# Patient Record
Sex: Male | Born: 1970 | Race: Black or African American | Hispanic: No | Smoking: Current some day smoker
Health system: Southern US, Community
[De-identification: ages and names within clinical notes are randomized; demographics above are authoritative.]

## PROBLEM LIST (undated history)

## (undated) DIAGNOSIS — M1990 Unspecified osteoarthritis, unspecified site: Secondary | ICD-10-CM

## (undated) DIAGNOSIS — F419 Anxiety disorder, unspecified: Secondary | ICD-10-CM

## (undated) DIAGNOSIS — F329 Major depressive disorder, single episode, unspecified: Secondary | ICD-10-CM

## (undated) DIAGNOSIS — F32A Depression, unspecified: Secondary | ICD-10-CM

## (undated) DIAGNOSIS — G43909 Migraine, unspecified, not intractable, without status migrainosus: Secondary | ICD-10-CM

## (undated) DIAGNOSIS — R519 Headache, unspecified: Secondary | ICD-10-CM

## (undated) HISTORY — PX: OTHER SURGICAL HISTORY: SHX169

## (undated) HISTORY — PX: NOSE SURGERY: SHX723

---
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
## 1992-12-27 HISTORY — PX: OTHER SURGICAL HISTORY: SHX169

## 2002-07-07 ENCOUNTER — Emergency Department (HOSPITAL_COMMUNITY): Admission: EM | Admit: 2002-07-07 | Discharge: 2002-07-07 | Payer: Self-pay | Admitting: Emergency Medicine

## 2003-06-02 ENCOUNTER — Encounter: Payer: Self-pay | Admitting: Emergency Medicine

## 2003-06-02 ENCOUNTER — Emergency Department (HOSPITAL_COMMUNITY): Admission: EM | Admit: 2003-06-02 | Discharge: 2003-06-02 | Payer: Self-pay | Admitting: Emergency Medicine

## 2004-06-08 ENCOUNTER — Emergency Department (HOSPITAL_COMMUNITY): Admission: EM | Admit: 2004-06-08 | Discharge: 2004-06-09 | Payer: Self-pay | Admitting: Emergency Medicine

## 2004-06-14 ENCOUNTER — Emergency Department (HOSPITAL_COMMUNITY): Admission: EM | Admit: 2004-06-14 | Discharge: 2004-06-14 | Payer: Self-pay | Admitting: Emergency Medicine

## 2004-09-17 ENCOUNTER — Emergency Department (HOSPITAL_COMMUNITY): Admission: EM | Admit: 2004-09-17 | Discharge: 2004-09-17 | Payer: Self-pay | Admitting: Emergency Medicine

## 2005-02-23 ENCOUNTER — Emergency Department (HOSPITAL_COMMUNITY): Admission: EM | Admit: 2005-02-23 | Discharge: 2005-02-23 | Payer: Self-pay | Admitting: Emergency Medicine

## 2011-12-19 ENCOUNTER — Emergency Department (HOSPITAL_COMMUNITY)
Admission: EM | Admit: 2011-12-19 | Discharge: 2011-12-19 | Disposition: A | Discharge status: Home / Self Care | Payer: Self-pay | Attending: Emergency Medicine | Admitting: Emergency Medicine

## 2011-12-19 DIAGNOSIS — R3916 Straining to void: Secondary | ICD-10-CM | POA: Insufficient documentation

## 2011-12-19 DIAGNOSIS — K5900 Constipation, unspecified: Secondary | ICD-10-CM | POA: Insufficient documentation

## 2011-12-19 DIAGNOSIS — R109 Unspecified abdominal pain: Secondary | ICD-10-CM | POA: Insufficient documentation

## 2011-12-19 DIAGNOSIS — F17200 Nicotine dependence, unspecified, uncomplicated: Secondary | ICD-10-CM | POA: Insufficient documentation

## 2011-12-19 LAB — URINALYSIS, ROUTINE W REFLEX MICROSCOPIC
Bilirubin Urine: NEGATIVE
Leukocytes, UA: NEGATIVE
Nitrite: NEGATIVE
Specific Gravity, Urine: 1.023 (ref 1.005–1.030)
pH: 5.5 (ref 5.0–8.0)

## 2011-12-19 MED ORDER — POLYETHYLENE GLYCOL 3350 17 GM/SCOOP PO POWD: 17.0000 g | Freq: Every day | ORAL | Status: AC

## 2011-12-19 NOTE — ED Notes (Signed)
Pt states that he has had this problem for about 2 weeks.

## 2011-12-19 NOTE — ED Notes (Signed)
Pt c/o difficulty urinating or having a BM

## 2011-12-19 NOTE — ED Provider Notes (Signed)
Medical screening examination/treatment/procedure(s) were performed by non-physician practitioner and as supervising physician I was immediately available for consultation/collaboration.   Esco Joslyn, MD 12/19/11 0742 
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
## 2012-03-11 ENCOUNTER — Encounter (HOSPITAL_COMMUNITY): Payer: Self-pay | Admitting: Emergency Medicine

## 2012-03-11 ENCOUNTER — Emergency Department (HOSPITAL_COMMUNITY)
Admission: EM | Admit: 2012-03-11 | Discharge: 2012-03-11 | Disposition: A | Discharge status: Home / Self Care | Payer: Self-pay | Attending: Emergency Medicine | Admitting: Emergency Medicine

## 2012-03-11 ENCOUNTER — Emergency Department (HOSPITAL_COMMUNITY): Payer: Self-pay

## 2012-03-11 DIAGNOSIS — J069 Acute upper respiratory infection, unspecified: Secondary | ICD-10-CM

## 2012-03-11 DIAGNOSIS — R059 Cough, unspecified: Secondary | ICD-10-CM

## 2012-03-11 DIAGNOSIS — R05 Cough: Secondary | ICD-10-CM

## 2012-03-11 DIAGNOSIS — R509 Fever, unspecified: Secondary | ICD-10-CM

## 2012-03-11 DIAGNOSIS — F17200 Nicotine dependence, unspecified, uncomplicated: Secondary | ICD-10-CM | POA: Insufficient documentation

## 2012-03-11 MED ORDER — HYDROCOD POLST-CHLORPHEN POLST 10-8 MG/5ML PO LQCR
5.0000 mL | Freq: Once | ORAL | Status: AC
  Administered 2012-03-11: 5 mL via ORAL
  Filled 2012-03-11: qty 5

## 2012-03-11 MED ORDER — AZITHROMYCIN 250 MG PO TABS: ORAL_TABLET | ORAL | Status: AC

## 2012-03-11 MED ORDER — HYDROCOD POLST-CHLORPHEN POLST 10-8 MG/5ML PO LQCR: 5.0000 mL | Freq: Two times a day (BID) | ORAL | Status: DC

## 2012-03-11 NOTE — ED Notes (Signed)
Pt reports nonproductive cough with SOB onset x3 days increased over last 24 hrs

## 2012-03-11 NOTE — Discharge Instructions (Signed)
You were seen and evaluated today for your symptoms of fever, cough and body aches. At this time your chest x-ray did not show any concerning signs for pneumonia infection causing her symptoms. Your providers have decided that you still may benefit from an antibiotic to help with your symptoms. You were given a prescription for Z-Pak to take for the next 5 days. Please take this as prescribed for the full length of time. You're also given a prescription for cough medication to help your symptoms of cough and body aches. Continue to use Tylenol or ibuprofen at home for any fever. Return if you have any worsening chest pain, shortness of breath, distant nausea vomiting.  Fever, Adult A fever is a temperature of 100.4 F (38 C) or above.  HOME CARE  Take fever medicine as told by your doctor. Do not  take aspirin for fever if you are younger than 39 years of age.   If you are given antibiotic medicine, take it as told. Finish the medicine even if you start to feel better.   Rest.   Drink enough fluids to keep your pee (urine) clear or pale yellow. Do not drink alcohol.   Take a bath or shower with room temperature water. Do not use ice water or alcohol sponge baths.   Wear lightweight, loose clothes.  GET HELP RIGHT AWAY IF:   You are short of breath or have trouble breathing.   You are very weak.   You are dizzy or you pass out (faint).   You are very thirsty or are making little or no urine.   You have new pain.   You throw up (vomit) or have watery poop (diarrhea).   You keep throwing up or having watery poop for more than 1 to 2 days.   You have a stiff neck or light bothers your eyes.   You have a skin rash.   You have a fever or problems (symptoms) that last for more than 2 to 3 days.   You have a fever and your problems quickly get worse.   You keep throwing up the fluids you drink.   You do not feel better after 3 days.   You have new problems.  MAKE SURE YOU:    Understand these instructions.   Will watch your condition.   Will get help right away if you are not doing well or get worse.  Document Released: 09/21/2008 Document Revised: 12/02/2011 Document Reviewed: 10/14/2011 [facility] Patient Information 2012 [place], [place].   Upper Respiratory Infection, Adult An upper respiratory infection (URI) is also sometimes known as the common cold. The upper respiratory tract includes the nose, sinuses, throat, trachea, and bronchi. Bronchi are the airways leading to the lungs. Most people improve within 1 week, but symptoms can last up to 2 weeks. A residual cough may last even longer.  CAUSES Many different viruses can infect the tissues lining the upper respiratory tract. The tissues become irritated and inflamed and often become very moist. Mucus production is also common. A cold is contagious. You can easily spread the virus to others by oral contact. This includes kissing, sharing a glass, coughing, or sneezing. Touching your mouth or nose and then touching a surface, which is then touched by another person, can also spread the virus. SYMPTOMS  Symptoms typically develop 1 to 3 days after you come in contact with a cold virus. Symptoms vary from person to person. They may include:  Runny nose.  Sneezing.   Nasal congestion.   Sinus irritation.   Sore throat.   Loss of voice (laryngitis).   Cough.   Fatigue.   Muscle aches.   Loss of appetite.   Headache.   Low-grade fever.  DIAGNOSIS  You might diagnose your own cold based on familiar symptoms, since most people get a cold 2 to 3 times a year. Your caregiver can confirm this based on your exam. Most importantly, your caregiver can check that your symptoms are not due to another disease such as strep throat, sinusitis, pneumonia, asthma, or epiglottitis. Blood tests, throat tests, and X-rays are not necessary to diagnose a common cold, but they may sometimes be helpful in excluding  other more serious diseases. Your caregiver will decide if any further tests are required. RISKS AND COMPLICATIONS  You may be at risk for a more severe case of the common cold if you smoke cigarettes, have chronic heart disease (such as heart failure) or lung disease (such as asthma), or if you have a weakened immune system. The very young and very old are also at risk for more serious infections. Bacterial sinusitis, middle ear infections, and bacterial pneumonia can complicate the common cold. The common cold can worsen asthma and chronic obstructive pulmonary disease (COPD). Sometimes, these complications can require emergency medical care and may be life-threatening. PREVENTION  The best way to protect against getting a cold is to practice good hygiene. Avoid oral or hand contact with people with cold symptoms. Wash your hands often if contact occurs. There is no clear evidence that vitamin C, vitamin E, echinacea, or exercise reduces the chance of developing a cold. However, it is always recommended to get plenty of rest and practice good nutrition. TREATMENT  Treatment is directed at relieving symptoms. There is no cure. Antibiotics are not effective, because the infection is caused by a virus, not by bacteria. Treatment may include:  Increased fluid intake. Sports drinks offer valuable electrolytes, sugars, and fluids.   Breathing heated mist or steam (vaporizer or shower).   Eating chicken soup or other clear broths, and maintaining good nutrition.   Getting plenty of rest.   Using gargles or lozenges for comfort.   Controlling fevers with ibuprofen or acetaminophen as directed by your caregiver.   Increasing usage of your inhaler if you have asthma.  Zinc gel and zinc lozenges, taken in the first 24 hours of the common cold, can shorten the duration and lessen the severity of symptoms. Pain medicines may help with fever, muscle aches, and throat pain. A variety of non-prescription  medicines are available to treat congestion and runny nose. Your caregiver can make recommendations and may suggest nasal or lung inhalers for other symptoms.  HOME CARE INSTRUCTIONS   Only take over-the-counter or prescription medicines for pain, discomfort, or fever as directed by your caregiver.   Use a warm mist humidifier or inhale steam from a shower to increase air moisture. This may keep secretions moist and make it easier to breathe.   Drink enough water and fluids to keep your urine clear or pale yellow.   Rest as needed.   Return to work when your temperature has returned to normal or as your caregiver advises. You may need to stay home longer to avoid infecting others. You can also use a face mask and careful hand washing to prevent spread of the virus.  SEEK MEDICAL CARE IF:   After the first few days, you feel you are getting worse  rather than better.   You need your caregiver's advice about medicines to control symptoms.   You develop chills, worsening shortness of breath, or brown or red sputum. These may be signs of pneumonia.   You develop yellow or brown nasal discharge or pain in the face, especially when you bend forward. These may be signs of sinusitis.   You develop a fever, swollen neck glands, pain with swallowing, or white areas in the back of your throat. These may be signs of strep throat.  SEEK IMMEDIATE MEDICAL CARE IF:   You have a fever.   You develop severe or persistent headache, ear pain, sinus pain, or chest pain.   You develop wheezing, a prolonged cough, cough up blood, or have a change in your usual mucus (if you have chronic lung disease).   You develop sore muscles or a stiff neck.  Document Released: 06/08/2001 Document Revised: 12/02/2011 Document Reviewed: 04/16/2011 [facility] Patient Information 2012 [place], [place].   RESOURCE GUIDE  Dental Problems  Patients with Medicaid: [facility] [phone] [address]                                           [phone] [address] Phone:  [phone]                                                  Phone:  [phone]  If unable to pay or uninsured, contact:  [facility] or [facility]. to become qualified for the adult dental clinic.  Chronic Pain Problems Contact [facility]  [phone] Patients need to be referred by their primary care doctor.  Insufficient Money for Medicine Contact [employer]:  call "211" or [facility] [phone].  No Primary Care Doctor Call [facility]  8197939660 Other agencies that provide inexpensive medical care    [facility]  [phone]    [facility]  [phone]    [facility]  [phone]    [facility]  [phone]    [facility]  [phone]    [facility]  [phone]  Psychological Services [facility]  [phone] [facility]  [phone] [facility]   [phone] (emergency services 309-884-3315)  Substance Abuse Resources [facility]  (628)434-7267 [facility] [phone] [facility] [phone] Floydene Flock (419)767-9577 Residential & Outpatient Substance Abuse Program  (609) 542-1263  Abuse/Neglect [facility] Child Abuse Hotline (954)376-6546 [facility] Child Abuse Hotline (813) 475-7431 (After Hours)  Emergency Shelter [facility] (972)612-0897  M[facility] [place] [facility] 726-601-5484 [facility] [phone]  MRSA Hotline #:   660-603-4921    [facility] Resources  [facility] [place] [facility]     [employer]                          [facility] Dept. [address] [place]  [address]      [address]  Blondell Reveal Phone:  [phone]                                   Phone:  [phone]                 Phone:  [phone]  [facility] Phone:  [phone]  [facility] Child Abuse Hotline 385-711-2201 [phone] (After Hours)
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
## 2012-03-11 NOTE — ED Notes (Signed)
Patient is AOx4 and comfortable with her discharge instructions. 

## 2012-03-17 ENCOUNTER — Emergency Department (HOSPITAL_BASED_OUTPATIENT_CLINIC_OR_DEPARTMENT_OTHER)
Admission: EM | Admit: 2012-03-17 | Discharge: 2012-03-17 | Disposition: A | Discharge status: Home / Self Care | Payer: Self-pay | Attending: Emergency Medicine | Admitting: Emergency Medicine

## 2012-03-17 ENCOUNTER — Other Ambulatory Visit: Payer: Self-pay

## 2012-03-17 ENCOUNTER — Encounter (HOSPITAL_BASED_OUTPATIENT_CLINIC_OR_DEPARTMENT_OTHER): Payer: Self-pay | Admitting: *Deleted

## 2012-03-17 ENCOUNTER — Emergency Department (INDEPENDENT_AMBULATORY_CARE_PROVIDER_SITE_OTHER): Payer: Self-pay

## 2012-03-17 DIAGNOSIS — F17200 Nicotine dependence, unspecified, uncomplicated: Secondary | ICD-10-CM | POA: Insufficient documentation

## 2012-03-17 DIAGNOSIS — R05 Cough: Secondary | ICD-10-CM | POA: Insufficient documentation

## 2012-03-17 DIAGNOSIS — J40 Bronchitis, not specified as acute or chronic: Secondary | ICD-10-CM | POA: Insufficient documentation

## 2012-03-17 DIAGNOSIS — M5380 Other specified dorsopathies, site unspecified: Secondary | ICD-10-CM | POA: Insufficient documentation

## 2012-03-17 DIAGNOSIS — Z79899 Other long term (current) drug therapy: Secondary | ICD-10-CM | POA: Insufficient documentation

## 2012-03-17 DIAGNOSIS — R059 Cough, unspecified: Secondary | ICD-10-CM | POA: Insufficient documentation

## 2012-03-17 DIAGNOSIS — M545 Low back pain: Secondary | ICD-10-CM

## 2012-03-17 DIAGNOSIS — M62830 Muscle spasm of back: Secondary | ICD-10-CM

## 2012-03-17 DIAGNOSIS — M549 Dorsalgia, unspecified: Secondary | ICD-10-CM | POA: Insufficient documentation

## 2012-03-17 LAB — CBC
Hemoglobin: 16.1 g/dL (ref 13.0–17.0)
MCH: 30.7 pg (ref 26.0–34.0)
MCV: 90.5 fL (ref 78.0–100.0)
Platelets: 280 10*3/uL (ref 150–400)
RBC: 5.24 MIL/uL (ref 4.22–5.81)
WBC: 7.1 10*3/uL (ref 4.0–10.5)

## 2012-03-17 LAB — TROPONIN I: Troponin I: <0.3 ng/mL (ref <0.30)

## 2012-03-17 LAB — BASIC METABOLIC PANEL
BUN: 14 mg/dL (ref 6–23)
CO2: 25 mEq/L (ref 19–32)
Chloride: 102 mEq/L (ref 96–112)
Creatinine, Ser: 1 mg/dL (ref 0.50–1.35)
GFR calc Af Amer: >90 mL/min (ref >90)
Potassium: 4.8 mEq/L (ref 3.5–5.1)

## 2012-03-17 LAB — DIFFERENTIAL
Basophils Relative: 0 % (ref 0–1)
Eosinophils Relative: 2 % (ref 0–5)
Lymphs Abs: 3.1 10*3/uL (ref 0.7–4.0)
Monocytes Absolute: 1.1 10*3/uL — ABNORMAL HIGH (ref 0.1–1.0)
Monocytes Relative: 15 % — ABNORMAL HIGH (ref 3–12)

## 2012-03-17 MED ORDER — NAPROXEN 500 MG PO TABS: 500.0000 mg | ORAL_TABLET | Freq: Two times a day (BID) | ORAL | Status: DC

## 2012-03-17 MED ORDER — KETOROLAC TROMETHAMINE 60 MG/2ML IM SOLN
60.0000 mg | Freq: Once | INTRAMUSCULAR | Status: AC
  Administered 2012-03-17: 60 mg via INTRAMUSCULAR
  Filled 2012-03-17: qty 2

## 2012-03-17 MED ORDER — ALBUTEROL SULFATE HFA 108 (90 BASE) MCG/ACT IN AERS: 1.0000 | INHALATION_SPRAY | Freq: Four times a day (QID) | RESPIRATORY_TRACT | Status: DC | PRN

## 2012-03-17 NOTE — ED Notes (Signed)
Pt reports cough x1 week. Pain is now in the back and worsens with coughing.

## 2012-03-17 NOTE — ED Notes (Signed)
Pt states that he was seen at [facility] on sat and Dx with lung infection states that cough has improved but now he is having pain in back while coughing
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
## 2012-03-17 NOTE — Discharge Instructions (Signed)
Spasticity Spasticity is a condition in which certain muscles contract continuously. This causes stiffness or tightness of the muscles. It may interfere with movement, speech, and manner of walking. CAUSES  This condition is usually caused by damage to the portion of the brain or spinal cord that controls voluntary movement. It may occur in association with:  Spinal cord injury.   Multiple sclerosis.   Cerebral palsy.   Brain damage due to lack of oxygen.   Brain trauma.   Severe head injury.   Metabolic diseases such as:   Adrenoleukodystrophy.   ALS (Lou Gehrig's disease).   Phenylketonuria.  SYMPTOMS   Increased muscle tone (hypertonicity).   A series of rapid muscle contractions (clonus).   Exaggerated deep tendon reflexes.   Muscle spasms.   Involuntary crossing of the legs (scissoring).   Fixed joints.  The degree of spasticity varies. It ranges from mild muscle stiffness to severe, painful, and uncontrollable muscle spasms. It can interfere with rehabilitation in patients with certain disorders. It often interferes with daily activities. TREATMENT  Treatment may include:  Medications.   Physical therapy regimens. They may include muscle stretching and range of motion exercises. These help prevent shrinkage or shortening of muscles. They also help reduce the severity of symptoms.   Surgery. This may be recommended for tendon release or to sever the nerve-muscle pathway.  PROGNOSIS  The outcome for those with spasticity depends on:  Severity of the spasticity.   Associated disorder(s).  Document Released: 12/03/2002 Document Revised: 12/02/2011 Document Reviewed: 12/13/2005 [employer] Patient Information 2012 [employer]. 

## 2012-05-27 ENCOUNTER — Emergency Department (HOSPITAL_COMMUNITY)
Admission: EM | Admit: 2012-05-27 | Discharge: 2012-05-28 | Disposition: A | Discharge status: Home / Self Care | Payer: Self-pay | Attending: Emergency Medicine | Admitting: Emergency Medicine

## 2012-05-27 DIAGNOSIS — S50812A Abrasion of left forearm, initial encounter: Secondary | ICD-10-CM

## 2012-05-27 DIAGNOSIS — Y93I9 Activity, other involving external motion: Secondary | ICD-10-CM | POA: Insufficient documentation

## 2012-05-27 DIAGNOSIS — Y998 Other external cause status: Secondary | ICD-10-CM | POA: Insufficient documentation

## 2012-05-27 DIAGNOSIS — IMO0002 Reserved for concepts with insufficient information to code with codable children: Secondary | ICD-10-CM | POA: Insufficient documentation

## 2012-05-27 DIAGNOSIS — S0501XA Injury of conjunctiva and corneal abrasion without foreign body, right eye, initial encounter: Secondary | ICD-10-CM

## 2012-05-27 DIAGNOSIS — S058X9A Other injuries of unspecified eye and orbit, initial encounter: Secondary | ICD-10-CM | POA: Insufficient documentation

## 2012-05-27 DIAGNOSIS — F17200 Nicotine dependence, unspecified, uncomplicated: Secondary | ICD-10-CM | POA: Insufficient documentation

## 2012-05-27 MED ORDER — TETRACAINE HCL 0.5 % OP SOLN: 1.0000 [drp] | Freq: Once | OPHTHALMIC | Status: DC

## 2012-05-27 MED ORDER — FLUORESCEIN SODIUM 1 MG OP STRP
1.0000 | ORAL_STRIP | Freq: Once | OPHTHALMIC | Status: AC
  Administered 2012-05-28: 1 via OPHTHALMIC

## 2012-05-27 MED ORDER — FLUORESCEIN SODIUM 1 MG OP STRP
ORAL_STRIP | OPHTHALMIC | Status: AC
  Administered 2012-05-28: 1 via OPHTHALMIC
  Filled 2012-05-27: qty 2

## 2012-05-27 MED ORDER — OXYCODONE-ACETAMINOPHEN 5-325 MG PO TABS
2.0000 | ORAL_TABLET | Freq: Once | ORAL | Status: AC
  Administered 2012-05-27: 2 via ORAL
  Filled 2012-05-27: qty 2

## 2012-05-27 MED ORDER — FLUORESCEIN SODIUM 1 MG OP STRP: 1.0000 | ORAL_STRIP | Freq: Once | OPHTHALMIC | Status: DC

## 2012-05-27 MED ORDER — TETRACAINE HCL 0.5 % OP SOLN
1.0000 [drp] | Freq: Once | OPHTHALMIC | Status: AC
  Administered 2012-05-27: 2 [drp] via OPHTHALMIC

## 2012-05-27 MED ORDER — TETRACAINE HCL 0.5 % OP SOLN
OPHTHALMIC | Status: AC
  Administered 2012-05-27: 2 [drp] via OPHTHALMIC
  Filled 2012-05-27: qty 2

## 2012-05-27 MED ORDER — CIPROFLOXACIN HCL 0.3 % OP SOLN
1.0000 [drp] | OPHTHALMIC | Status: DC
  Administered 2012-05-28: 1 [drp] via OPHTHALMIC
  Filled 2012-05-27: qty 2.5
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
## 2012-05-28 MED ORDER — OXYCODONE-ACETAMINOPHEN 5-325 MG PO TABS: 1.0000 | ORAL_TABLET | ORAL | Status: AC | PRN

## 2012-05-28 MED ORDER — NAPROXEN 500 MG PO TABS: 500.0000 mg | ORAL_TABLET | Freq: Two times a day (BID) | ORAL | Status: DC

## 2012-05-28 NOTE — Discharge Instructions (Signed)
Corneal abrasion  You have a scratch of the eye on the cornea(the clear part of the eye. This condition may be caused by trauma. It is a common problem for people who wear contact lenses. Proper treatment it is important. No evidence of infection is noted today, but you could develop an infection called a corneal ulcer or have some retained foreign body that may or may not have been noted today in the emergecy department. Ulcers are not only painful, but they may also scar the cornea and cause permanent damage to the eye.  If you wear contact lenses, do not use them until your caregiver approves. Followup care is necessary to be sure the corneal abrasion is healing in 1-2 days. See your caregiver or eye specialist as suggested for followup.   Return immediately if you develop severe pain, pus drainage, changes in vision, fever, or other concerns.  Please use the antibiotic drops 1 drop in the effected eye every 4 hours until you follow up with Opthalmology. (eye specialist).

## 2012-05-28 NOTE — ED Notes (Signed)
500 ml of irrigation to bilateral eyes. Pt diaphoretic and states "take it out now, I am having a panic attack. Morgan lens removed. Comfort given. Pt more calm and decrease in diaphoresis.

## 2012-05-28 NOTE — ED Notes (Signed)
Morgan lenses removed per patient's demand.

## 2012-05-28 NOTE — ED Notes (Signed)
Patient is AOx4 and comfortable with his discharge instructions.  Patient's family is driving him home.

## 2012-05-28 NOTE — ED Notes (Signed)
[employer] at bedside 

## 2012-07-15 ENCOUNTER — Emergency Department (HOSPITAL_BASED_OUTPATIENT_CLINIC_OR_DEPARTMENT_OTHER)
Admission: EM | Admit: 2012-07-15 | Discharge: 2012-07-15 | Discharge status: Against Medical Advice | Payer: Self-pay | Attending: Emergency Medicine | Admitting: Emergency Medicine

## 2012-07-15 ENCOUNTER — Encounter (HOSPITAL_BASED_OUTPATIENT_CLINIC_OR_DEPARTMENT_OTHER): Payer: Self-pay | Admitting: *Deleted

## 2012-07-15 ENCOUNTER — Emergency Department (HOSPITAL_BASED_OUTPATIENT_CLINIC_OR_DEPARTMENT_OTHER): Payer: Self-pay

## 2012-07-15 DIAGNOSIS — R0602 Shortness of breath: Secondary | ICD-10-CM | POA: Insufficient documentation

## 2012-07-15 DIAGNOSIS — F17200 Nicotine dependence, unspecified, uncomplicated: Secondary | ICD-10-CM | POA: Insufficient documentation

## 2012-07-15 LAB — BASIC METABOLIC PANEL
BUN: 11 mg/dL (ref 6–23)
CO2: 28 mEq/L (ref 19–32)
Chloride: 107 mEq/L (ref 96–112)
Glucose, Bld: 101 mg/dL — ABNORMAL HIGH (ref 70–99)
Potassium: 3.7 mEq/L (ref 3.5–5.1)
Sodium: 142 mEq/L (ref 135–145)

## 2012-07-15 LAB — CBC WITH DIFFERENTIAL/PLATELET
Hemoglobin: 14.3 g/dL (ref 13.0–17.0)
Lymphocytes Relative: 40 % (ref 12–46)
Lymphs Abs: 3.1 10*3/uL (ref 0.7–4.0)
Monocytes Relative: 13 % — ABNORMAL HIGH (ref 3–12)
Neutrophils Relative %: 44 % (ref 43–77)
Platelets: 255 10*3/uL (ref 150–400)
RBC: 4.63 MIL/uL (ref 4.22–5.81)
WBC: 7.8 10*3/uL (ref 4.0–10.5)

## 2012-07-15 NOTE — ED Notes (Signed)
Pt c/o SHOB since Thursday. Denies other s/s.

## 2012-07-15 NOTE — ED Provider Notes (Signed)
Medical screening examination/treatment/procedure(s) were performed by non-physician practitioner and as supervising physician I was immediately available for consultation/collaboration.   Adyn Hoes, MD 07/15/12 2335 
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
## 2012-09-21 ENCOUNTER — Encounter (HOSPITAL_BASED_OUTPATIENT_CLINIC_OR_DEPARTMENT_OTHER): Payer: Self-pay | Admitting: *Deleted

## 2012-09-21 ENCOUNTER — Emergency Department (HOSPITAL_BASED_OUTPATIENT_CLINIC_OR_DEPARTMENT_OTHER)
Admission: EM | Admit: 2012-09-21 | Discharge: 2012-09-21 | Disposition: A | Discharge status: Home / Self Care | Payer: Self-pay | Attending: Emergency Medicine | Admitting: Emergency Medicine

## 2012-09-21 DIAGNOSIS — M545 Low back pain, unspecified: Secondary | ICD-10-CM | POA: Insufficient documentation

## 2012-09-21 DIAGNOSIS — M546 Pain in thoracic spine: Secondary | ICD-10-CM | POA: Insufficient documentation

## 2012-09-21 DIAGNOSIS — Z0279 Encounter for issue of other medical certificate: Secondary | ICD-10-CM | POA: Insufficient documentation

## 2012-09-21 DIAGNOSIS — F17200 Nicotine dependence, unspecified, uncomplicated: Secondary | ICD-10-CM | POA: Insufficient documentation

## 2012-09-21 DIAGNOSIS — M549 Dorsalgia, unspecified: Secondary | ICD-10-CM
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
## 2012-09-21 NOTE — ED Notes (Signed)
Pt. Is being assessed by Verline Lema PA C

## 2012-09-21 NOTE — ED Notes (Signed)
Pt. Reports he hurt his back a little yesterday and needs a Drs. Note to return to work tomorrow.

## 2012-09-22 NOTE — ED Provider Notes (Signed)
Medical screening examination/treatment/procedure(s) were performed by non-physician practitioner and as supervising physician I was immediately available for consultation/collaboration.   Gerhard Munch, MD 09/22/12 [phone]

## 2013-06-24 ENCOUNTER — Emergency Department (HOSPITAL_COMMUNITY)
Admission: EM | Admit: 2013-06-24 | Discharge: 2013-06-24 | Disposition: A | Discharge status: Home / Self Care | Payer: Self-pay | Attending: Emergency Medicine | Admitting: Emergency Medicine

## 2013-06-24 ENCOUNTER — Encounter (HOSPITAL_COMMUNITY): Payer: Self-pay | Admitting: Emergency Medicine

## 2013-06-24 ENCOUNTER — Emergency Department (HOSPITAL_COMMUNITY): Payer: Self-pay

## 2013-06-24 DIAGNOSIS — G44009 Cluster headache syndrome, unspecified, not intractable: Secondary | ICD-10-CM | POA: Insufficient documentation

## 2013-06-24 DIAGNOSIS — F17200 Nicotine dependence, unspecified, uncomplicated: Secondary | ICD-10-CM | POA: Insufficient documentation

## 2013-06-24 DIAGNOSIS — J3489 Other specified disorders of nose and nasal sinuses: Secondary | ICD-10-CM | POA: Insufficient documentation

## 2013-06-24 DIAGNOSIS — Z7982 Long term (current) use of aspirin: Secondary | ICD-10-CM | POA: Insufficient documentation

## 2013-06-24 DIAGNOSIS — H5789 Other specified disorders of eye and adnexa: Secondary | ICD-10-CM | POA: Insufficient documentation

## 2013-06-24 DIAGNOSIS — H53149 Visual discomfort, unspecified: Secondary | ICD-10-CM | POA: Insufficient documentation

## 2013-06-24 MED ORDER — DIPHENHYDRAMINE HCL 50 MG/ML IJ SOLN
25.0000 mg | Freq: Once | INTRAMUSCULAR | Status: DC
  Filled 2013-06-24: qty 1

## 2013-06-24 MED ORDER — DEXAMETHASONE SODIUM PHOSPHATE 10 MG/ML IJ SOLN
10.0000 mg | Freq: Once | INTRAMUSCULAR | Status: AC
  Administered 2013-06-24: 10 mg via INTRAVENOUS

## 2013-06-24 MED ORDER — KETOROLAC TROMETHAMINE 30 MG/ML IJ SOLN
30.0000 mg | Freq: Once | INTRAMUSCULAR | Status: AC
  Administered 2013-06-24: 30 mg via INTRAVENOUS
  Filled 2013-06-24: qty 1

## 2013-06-24 MED ORDER — KETOROLAC TROMETHAMINE 60 MG/2ML IM SOLN
60.0000 mg | Freq: Once | INTRAMUSCULAR | Status: DC
  Filled 2013-06-24: qty 2

## 2013-06-24 MED ORDER — METOCLOPRAMIDE HCL 5 MG/ML IJ SOLN
10.0000 mg | Freq: Once | INTRAMUSCULAR | Status: AC
  Administered 2013-06-24: 10 mg via INTRAVENOUS

## 2013-06-24 MED ORDER — DIPHENHYDRAMINE HCL 50 MG/ML IJ SOLN
25.0000 mg | Freq: Once | INTRAMUSCULAR | Status: AC
  Administered 2013-06-24: 25 mg via INTRAVENOUS

## 2013-06-24 MED ORDER — SUMATRIPTAN SUCCINATE 6 MG/0.5ML ~~LOC~~ SOLN
6.0000 mg | Freq: Once | SUBCUTANEOUS | Status: AC
  Administered 2013-06-24: 6 mg via SUBCUTANEOUS
  Filled 2013-06-24: qty 0.5

## 2013-06-24 MED ORDER — SODIUM CHLORIDE 0.9 % IV SOLN
Freq: Once | INTRAVENOUS | Status: AC
  Administered 2013-06-24: 20:00:00 via INTRAVENOUS

## 2013-06-24 MED ORDER — DEXAMETHASONE SODIUM PHOSPHATE 10 MG/ML IJ SOLN
10.0000 mg | Freq: Once | INTRAMUSCULAR | Status: DC
  Filled 2013-06-24: qty 1

## 2013-06-24 MED ORDER — METOCLOPRAMIDE HCL 5 MG/ML IJ SOLN
10.0000 mg | Freq: Once | INTRAMUSCULAR | Status: DC
  Filled 2013-06-24: qty 2

## 2013-06-24 NOTE — ED Notes (Addendum)
Pt c/o r side headache x4 days.  Denies hx of migraines.  Reports taking otc pain medication with no relief. Denies n/v or blurred vision.  Pt reports clear nasal and eye drainage since last night.  Pt also denies any mental status changes.  PT NAD at this time.

## 2013-06-24 NOTE — ED Provider Notes (Signed)
History    CSN: [id number] Arrival date & time 06/24/13  1446  First MD Initiated Contact with Patient 06/24/13 1552     Chief Complaint  Patient presents with  . Headache   (Consider location/radiation/quality/duration/timing/severity/associated sxs/prior Treatment) The history is provided by the patient and medical records. No language interpreter was used.    Jeffrey Cox is a 42 y.o. male  with no medical hx presents to the Emergency Department complaining of gradual, persistent, progressively worsening right sided headache beginning 4 days ago.  He has been taking ASA for the pain which helps, but does not alleviate the pain. Pt states he does not regularly get headaches and his last headache was > 20 years ago.  He denies sick contacts, weakness, numbness.  He has associated watering of the right eye and rhinorrhea of the right nare.  He describes the pain as throbbing beginning behind his right eye and radiating to the back of his neck.  Light and loud noises make the pain worse.  Pt denies fever, chills, chest pain, SOB, abdominal pain, nausea, vomiting, diarrhea, weakness, dizziness, syncope, otalgia, sinus congestion, sore throat.       History reviewed. No pertinent past medical history. Past Surgical History  Procedure Laterality Date  . Hand surgey      right hand   No family history on file. History  Substance Use Topics  . Smoking status: Current Every Day Smoker  . Smokeless tobacco: Not on file  . Alcohol Use: Yes    Review of Systems  Constitutional: Negative for fever, diaphoresis, appetite change, fatigue and unexpected weight change.  HENT: Positive for rhinorrhea. Negative for ear pain, congestion, sneezing, mouth sores, neck pain, neck stiffness, dental problem, postnasal drip and sinus pressure.   Eyes: Positive for photophobia, discharge and redness. Negative for visual disturbance.  Respiratory: Negative for cough, chest tightness, shortness of breath  and wheezing.   Cardiovascular: Negative for chest pain.  Gastrointestinal: Negative for nausea, vomiting, abdominal pain, diarrhea and constipation.  Endocrine: Negative for polydipsia, polyphagia and polyuria.  Genitourinary: Negative for dysuria, urgency, frequency and hematuria.  Musculoskeletal: Negative for back pain.  Skin: Negative for rash.  Allergic/Immunologic: Negative for immunocompromised state.  Neurological: Positive for headaches. Negative for syncope and light-headedness.  Hematological: Does not bruise/bleed easily.  Psychiatric/Behavioral: Negative for sleep disturbance. The patient is not nervous/anxious.     Allergies  Review of patient's allergies indicates no known allergies.  Home Medications   Current Outpatient Rx  Name  Route  Sig  Dispense  Refill  . acetaminophen (TYLENOL) 325 MG tablet   Oral   Take 975 mg by mouth every 6 (six) hours as needed for pain.         Marland Kitchen aspirin 325 MG tablet   Oral   Take 975 mg by mouth every 6 (six) hours as needed for pain.         Marland Kitchen ibuprofen (ADVIL,MOTRIN) 200 MG tablet   Oral   Take 800 mg by mouth every 6 (six) hours as needed for pain.         . naproxen sodium (ANAPROX) 220 MG tablet   Oral   Take 440 mg by mouth every 8 (eight) hours as needed (pain).          BP 120/81  Pulse 80  Temp(Src) 98.1 F (36.7 C) (Oral)  Resp 20  SpO2 100% Physical Exam  Nursing note and vitals reviewed. Constitutional: He is oriented to  person, place, and time. He appears well-developed and well-nourished. No distress.  HENT:  Head: Normocephalic and atraumatic.  Right Ear: Hearing, tympanic membrane, external ear and ear canal normal.  Left Ear: Hearing, tympanic membrane, external ear and ear canal normal.  Nose: Rhinorrhea (right nare only) present. No sinus tenderness. Right sinus exhibits no maxillary sinus tenderness and no frontal sinus tenderness. Left sinus exhibits no maxillary sinus tenderness and no  frontal sinus tenderness.  Mouth/Throat: Uvula is midline, oropharynx is clear and moist and mucous membranes are normal. Mucous membranes are not dry. No edematous. No oropharyngeal exudate, posterior oropharyngeal edema, posterior oropharyngeal erythema or tonsillar abscesses.  Eyes: EOM and lids are normal. Pupils are equal, round, and reactive to light. Right eye exhibits no chemosis, no discharge, no exudate and no hordeolum. No foreign body present in the right eye. Left eye exhibits no chemosis, no discharge, no exudate and no hordeolum. No foreign body present in the left eye. Right conjunctiva is injected. Right conjunctiva has no hemorrhage. Left conjunctiva is not injected. Left conjunctiva has no hemorrhage. No scleral icterus. Right eye exhibits normal extraocular motion. Left eye exhibits normal extraocular motion.  Significant, persistent lacrimation of the right eye Tenderness to the area surrounding the eye; no bony or sinus tenderness  Neck: Normal range of motion, full passive range of motion without pain and phonation normal. Neck supple. No spinous process tenderness and no muscular tenderness present. No rigidity. Normal range of motion present.  Cardiovascular: Normal rate, regular rhythm, S1 normal, S2 normal and intact distal pulses.   Pulses:      Radial pulses are 2+ on the right side, and 2+ on the left side.       Dorsalis pedis pulses are 2+ on the right side, and 2+ on the left side.       Posterior tibial pulses are 2+ on the right side, and 2+ on the left side.  Pulmonary/Chest: Effort normal and breath sounds normal. No accessory muscle usage or stridor. Not tachypneic. No respiratory distress. He has no decreased breath sounds. He has no wheezes. He has no rhonchi. He has no rales. He exhibits no tenderness and no bony tenderness.  Abdominal: Soft. Normal appearance and bowel sounds are normal. There is no tenderness. There is no rigidity, no rebound, no guarding and no  CVA tenderness.  Musculoskeletal: Normal range of motion.  Lymphadenopathy:       Head (right side): No submental, no submandibular, no tonsillar, no preauricular, no posterior auricular and no occipital adenopathy present.       Head (left side): No submental, no tonsillar, no preauricular, no posterior auricular and no occipital adenopathy present.    He has no cervical adenopathy.       Right cervical: No superficial cervical, no deep cervical and no posterior cervical adenopathy present.      Left cervical: No superficial cervical, no deep cervical and no posterior cervical adenopathy present.  Neurological: He is alert and oriented to person, place, and time. He has normal strength and normal reflexes. No cranial nerve deficit or sensory deficit. He exhibits normal muscle tone. He displays a negative Romberg sign. Coordination and gait normal. GCS eye subscore is 4. GCS verbal subscore is 5. GCS motor subscore is 6.  Reflex Scores:      Tricep reflexes are 2+ on the right side and 2+ on the left side.      Bicep reflexes are 2+ on the right side and 2+  on the left side.      Brachioradialis reflexes are 2+ on the right side and 2+ on the left side.      Patellar reflexes are 2+ on the right side and 2+ on the left side.      Achilles reflexes are 2+ on the right side and 2+ on the left side. Speech is clear and goal oriented, follows commands Cranial nerves III - XII without deficit, no facial droop Normal strength in upper and lower extremities bilaterally, strong and equal grip strength Sensation normal to light and sharp touch Moves extremities without ataxia, coordination intact Normal finger to nose and rapid alternating movements Neg romberg, no pronator drift Normal gait Normal heel-shin and balance   Skin: Skin is warm and dry. No rash noted. He is not diaphoretic.  Psychiatric: He has a normal mood and affect. His speech is normal and behavior is normal. Judgment and thought  content normal.    ED Course  Procedures (including critical care time) Labs Reviewed - No data to display Ct Head Wo Contrast  06/24/2013   *RADIOLOGY REPORT*  Clinical Data: Headache, right-sided pain, no trauma  CT HEAD WITHOUT CONTRAST  Technique:  Contiguous axial images were obtained from the base of the skull through the vertex without contrast.  Comparison: None.  Findings: There is no acute intracranial hemorrhage or infarct.  No midline shift or mass lesion.  CSF containing spaces are normal. No extra-axial fluid collection.  Circumferential mucosal thickening is present within the right maxillary sinus.  The frontal sinuses and ethmoidal air cells are partially opacified, right greater than left.  The mastoid air cells are clear.  The calvarium is intact.  IMPRESSION: 1.  No acute intracranial process.  2.  Right maxillary and ethmoidal sinus disease.   Original Report Authenticated By: Rise Mu, M.D.   1. Cluster headache     MDM  Kirstie Mirza presents with 4 days of headache.  Hx and PE clinically consistent with cluster headache.  Pt recently under a significant amount of personal stress.  Will give O2, sumatriptan as abortive therapy.  Will also obtain CT scan as pt does not usually have headaches and this is the worst headache of his life.  Pt neurologically intact without deficit on exam, A&Ox4, NAD, nontoxic, nonseptic appearing.    7:00PM Head CT without acute intracranial process.  I personally reviewed the imaging tests through PACS system.  I reviewed available ER/hospitalization records through the EMR.  Patient states only minor relief of headache with oxygen therapy and sumatriptan. Will give migraine cocktail.  8:29 PM Patient reports headache has resolved completely. He feels much better and is ready to return home.  He remains without neurologic deficit.  Recommended he use the resource guide to find a primary care physician for further evaluation and  treatment as needed if headaches.  I have also discussed reasons to return immediately to the ER.  Patient expresses understanding and agrees with plan.      Dahlia Client Stephanine Reas, PA-C 06/24/13 2029

## 2013-06-26 NOTE — ED Provider Notes (Signed)
History/physical exam/procedure(s) were performed by non-physician practitioner and as supervising physician I was immediately available for consultation/collaboration. I have reviewed all notes and am in agreement with care and plan.   Hilario Quarry, MD 06/26/13 [phone]

## 2013-09-25 ENCOUNTER — Encounter (HOSPITAL_BASED_OUTPATIENT_CLINIC_OR_DEPARTMENT_OTHER): Payer: Self-pay | Admitting: *Deleted

## 2013-09-25 ENCOUNTER — Emergency Department (HOSPITAL_BASED_OUTPATIENT_CLINIC_OR_DEPARTMENT_OTHER)
Admission: EM | Admit: 2013-09-25 | Discharge: 2013-09-25 | Disposition: A | Discharge status: Home / Self Care | Payer: Self-pay | Attending: Emergency Medicine | Admitting: Emergency Medicine

## 2013-09-25 DIAGNOSIS — R51 Headache: Secondary | ICD-10-CM | POA: Insufficient documentation

## 2013-09-25 DIAGNOSIS — F17200 Nicotine dependence, unspecified, uncomplicated: Secondary | ICD-10-CM | POA: Insufficient documentation

## 2013-09-25 MED ORDER — SUMATRIPTAN SUCCINATE 6 MG/0.5ML ~~LOC~~ SOLN
6.0000 mg | Freq: Once | SUBCUTANEOUS | Status: AC
  Administered 2013-09-25: 6 mg via SUBCUTANEOUS
  Filled 2013-09-25: qty 0.5

## 2013-09-25 MED ORDER — SUMATRIPTAN SUCCINATE 100 MG PO TABS: 100.0000 mg | ORAL_TABLET | ORAL | Status: DC | PRN

## 2013-09-25 NOTE — ED Notes (Addendum)
Headache started yesterday, pain is better today. Has had a cluster headache in the past

## 2013-09-25 NOTE — ED Provider Notes (Signed)
:TIME SEEN: 7:21 AM  CHIEF COMPLAINT: Cluster headache  HPI: Patient is a 42 y.o. male with a history of cluster headaches who presents the emergency department with a gradual onset posterior, dull headache that radiates into the top of his head and into his left eye that started yesterday morning. He states this feels like his prior cluster headaches. It is worse with lights and sounds. Slightly improved with ibuprofen, Aleve and Tylenol at home. He describes the headache as mild now but last night describes as severe. He has had to be evaluated in the emergency department once before and reports his headache improved after Imitrex, oxygen, Toradol, Reglan, Benadryl. He denies any thunderclap headache. No fever, neck pain or neck stiffness, sick contacts, head injury, anticoagulation, numbness or tingling, focal weakness. He states he did not get any lacrimation or rhinorrhea with this headache.  ROS: See HPI Constitutional: no fever  Eyes: no drainage  ENT: no runny nose   Cardiovascular:  no chest pain  Resp: no SOB  GI: no vomiting GU: no dysuria Integumentary: no rash  Allergy: no hives  Musculoskeletal: no leg swelling  Neurological: no slurred speech ROS otherwise negative  PAST MEDICAL HISTORY/PAST SURGICAL HISTORY:  History reviewed. No pertinent past medical history.  MEDICATIONS:  Prior to Admission medications   Medication Sig Start Date End Date Taking? Authorizing Provider  acetaminophen (TYLENOL) 325 MG tablet Take 975 mg by mouth every 6 (six) hours as needed for pain.    Historical Provider, MD  aspirin 325 MG tablet Take 975 mg by mouth every 6 (six) hours as needed for pain.    Historical Provider, MD  ibuprofen (ADVIL,MOTRIN) 200 MG tablet Take 800 mg by mouth every 6 (six) hours as needed for pain.    Historical Provider, MD  naproxen sodium (ANAPROX) 220 MG tablet Take 440 mg by mouth every 8 (eight) hours as needed (pain).    Historical Provider, MD     ALLERGIES:  No Known Allergies  SOCIAL HISTORY:  History  Substance Use Topics  . Smoking status: Current Every Day Smoker  . Smokeless tobacco: Not on file  . Alcohol Use: Yes    FAMILY HISTORY: No family history on file.  EXAM: BP 121/74  Pulse 84  Temp(Src) 98.4 F (36.9 C) (Oral)  Resp 18  Ht 5\' 8"  (1.727 m)  Wt 283 lb (128.368 kg)  BMI 43.04 kg/m2  SpO2 100% CONSTITUTIONAL: Alert and oriented and responds appropriately to questions. Well-appearing; well-nourished HEAD: Normocephalic EYES: Conjunctivae clear, PERRL ENT: normal nose; no rhinorrhea; moist mucous membranes; pharynx without lesions noted NECK: Supple, no meningismus, no LAD  CARD: RRR; S1 and S2 appreciated; no murmurs, no clicks, no rubs, no gallops RESP: Normal chest excursion without splinting or tachypnea; breath sounds clear and equal bilaterally; no wheezes, no rhonchi, no rales,  ABD/GI: Normal bowel sounds; non-distended; soft, non-tender, no rebound, no guarding BACK:  The back appears normal and is non-tender to palpation, there is no CVA tenderness EXT: Normal ROM in all joints; non-tender to palpation; no edema; normal capillary refill; no cyanosis    SKIN: Normal color for age and race; warm NEURO: Moves all extremities equally, strength 5/5 in all 4 extremities, sensation to light touch intact diffusely, cranial nerves II through XII intact, normal gait PSYCH: The patient's mood and manner are appropriate. Grooming and personal hygiene are appropriate.  MEDICAL DECISION MAKING: Patient here with his typical cluster headache. He reports that it has improved after taking  medications at home but has not completely gone. I am not concerned for intracranial hemorrhage, infarct, meningitis or encephalitis given patient is afebrile,, anticoagulation is neurologically intact. Will place oxygen on patient and give Imitrex and reassess.  ED PROGRESS: Patient reports his headache is almost completely  gone he is starting to go home. Will discharge home with prescription for Imitrex given that is giving him relief in the past as well as today. Given usual and customary return precautions. Patient verbalized understanding and is comfortable with plan.     Layla Maw Kourtney Montesinos, DO 09/25/13 [phone]

## 2013-10-23 ENCOUNTER — Encounter (HOSPITAL_BASED_OUTPATIENT_CLINIC_OR_DEPARTMENT_OTHER): Payer: Self-pay | Admitting: Emergency Medicine

## 2013-10-23 ENCOUNTER — Emergency Department (HOSPITAL_BASED_OUTPATIENT_CLINIC_OR_DEPARTMENT_OTHER)
Admission: EM | Admit: 2013-10-23 | Discharge: 2013-10-23 | Disposition: A | Discharge status: Home / Self Care | Payer: Self-pay | Attending: Emergency Medicine | Admitting: Emergency Medicine

## 2013-10-23 DIAGNOSIS — G44009 Cluster headache syndrome, unspecified, not intractable: Secondary | ICD-10-CM | POA: Insufficient documentation

## 2013-10-23 DIAGNOSIS — F17200 Nicotine dependence, unspecified, uncomplicated: Secondary | ICD-10-CM | POA: Insufficient documentation

## 2013-10-23 HISTORY — DX: Migraine, unspecified, not intractable, without status migrainosus: G43.909

## 2013-10-23 MED ORDER — METOCLOPRAMIDE HCL 5 MG/ML IJ SOLN: 10.0000 mg | Freq: Once | INTRAMUSCULAR | Status: DC

## 2013-10-23 MED ORDER — KETOROLAC TROMETHAMINE 30 MG/ML IJ SOLN: 30.0000 mg | Freq: Once | INTRAMUSCULAR | Status: DC

## 2013-10-23 MED ORDER — SUMATRIPTAN SUCCINATE 6 MG/0.5ML ~~LOC~~ SOLN
6.0000 mg | Freq: Once | SUBCUTANEOUS | Status: AC
  Administered 2013-10-23: 6 mg via SUBCUTANEOUS
  Filled 2013-10-23: qty 0.5

## 2013-10-23 MED ORDER — DIPHENHYDRAMINE HCL 50 MG/ML IJ SOLN: 50.0000 mg | Freq: Once | INTRAMUSCULAR | Status: DC

## 2013-10-23 MED ORDER — SUMATRIPTAN SUCCINATE 6 MG/0.5ML ~~LOC~~ SOLN: 6.0000 mg | SUBCUTANEOUS | Status: DC | PRN

## 2013-10-23 NOTE — ED Notes (Signed)
Headaches for past couple days.  Diagnosed with cluster migraines recently but cannot afford the medicine.

## 2013-10-23 NOTE — ED Provider Notes (Signed)
CSN: [id number]     Arrival date & time 10/23/13  1854 History   First MD Initiated Contact with Patient 10/23/13 2056     Chief Complaint  Patient presents with  . Migraine   (Consider location/radiation/quality/duration/timing/severity/associated sxs/prior Treatment) HPI Pt presents with c/o headache.  He has been diagnosed in the past with cluster headaches and states he cannot afford the imitrex that he has been prescribed in the past. Headache has been constant over the past several days.  Pain is constant, tight feeling behind his eyes.  Also associated with photophobia and tearing of eyes and running of nose.  No fever/chills.  No neck pain.  Headache was gradual in onset.  He has been taking ibuprofen with minimal relief.  There are no other associated systemic symptoms, there are no other alleviating or modifying factors.   Past Medical History  Diagnosis Date  . Migraines    Past Surgical History  Procedure Laterality Date  . Hand surgey      right hand  . Nose surgery     No family history on file. History  Substance Use Topics  . Smoking status: Current Every Day Smoker -- 0.50 packs/day  . Smokeless tobacco: Not on file  . Alcohol Use: Yes    Review of Systems ROS reviewed and all otherwise negative except for mentioned in HPI  Allergies  Review of patient's allergies indicates no known allergies.  Home Medications   Current Outpatient Rx  Name  Route  Sig  Dispense  Refill  . acetaminophen (TYLENOL) 325 MG tablet   Oral   Take 975 mg by mouth every 6 (six) hours as needed for pain.         Marland Kitchen aspirin 325 MG tablet   Oral   Take 975 mg by mouth every 6 (six) hours as needed for pain.         Marland Kitchen ibuprofen (ADVIL,MOTRIN) 200 MG tablet   Oral   Take 800 mg by mouth every 6 (six) hours as needed for pain.         . naproxen sodium (ANAPROX) 220 MG tablet   Oral   Take 440 mg by mouth every 8 (eight) hours as needed (pain).         . SUMAtriptan  (IMITREX) 100 MG tablet   Oral   Take 1 tablet (100 mg total) by mouth every 2 (two) hours as needed for migraine. May repeat in 2 hours if headache persists or recurs.   10 tablet   0   . SUMAtriptan (IMITREX) 6 MG/0.5ML SOLN injection   Subcutaneous   Inject 0.5 mLs (6 mg total) into the skin every 2 (two) hours as needed for migraine or headache. May repeat in 2 hours if headache persists or recurs.   4 vial   0    BP 130/85  Pulse 83  Temp(Src) 99 F (37.2 C) (Oral)  Resp 16  Ht 5\' 7"  (1.702 m)  Wt 280 lb (127.007 kg)  BMI 43.84 kg/m2  SpO2 98% Vitals reviewed Physical Exam Physical Examination: General appearance - alert, well appearing, and in no distress Mental status - alert, oriented to person, place, and time Eyes - pupils equal and reactive, extraocular eye movements intact Mouth - mucous membranes moist, pharynx normal without lesions Neck - supple, no significant adenopathy Chest - clear to auscultation, no wheezes, rales or rhonchi, symmetric air entry Heart - normal rate, regular rhythm, normal S1, S2, no murmurs, rubs, clicks  or gallops Abdomen - soft, nontender, nondistended, no masses or organomegaly Neurological - alert, oriented x 3, cranial nerves 2-12 tested and intact, strength 5/5 in extremities x 4, sensation intact Extremities - peripheral pulses normal, no pedal edema, no clubbing or cyanosis Skin - normal coloration and turgor, no rashes  ED Course  Procedures (including critical care time) Labs Review Labs Reviewed - No data to display Imaging Review No results found.  EKG Interpretation   None       MDM   1. Cluster headache    Pt presenting with headache similar to his prior headaches, symptoms and exam are c/w cluster type headaches.  Pt treated with hi flow oxygen, given imitrex and had improvement in his symptoms.  Neurological exam is reassuring.  Pt given information for neurology followup.  Discharged with strict return  precautions.  Pt agreeable with plan.    Ethelda Chick, MD 10/24/13 [phone]

## 2013-10-23 NOTE — Progress Notes (Signed)
Placed patient of 100% non rebreather for cluster headache

## 2013-11-13 ENCOUNTER — Ambulatory Visit: Payer: Self-pay

## 2014-01-01 ENCOUNTER — Emergency Department (HOSPITAL_BASED_OUTPATIENT_CLINIC_OR_DEPARTMENT_OTHER)
Admission: EM | Admit: 2014-01-01 | Discharge: 2014-01-01 | Disposition: A | Discharge status: Home / Self Care | Payer: Self-pay | Attending: Emergency Medicine | Admitting: Emergency Medicine

## 2014-01-01 ENCOUNTER — Encounter (HOSPITAL_BASED_OUTPATIENT_CLINIC_OR_DEPARTMENT_OTHER): Payer: Self-pay | Admitting: Emergency Medicine

## 2014-01-01 DIAGNOSIS — Z7982 Long term (current) use of aspirin: Secondary | ICD-10-CM | POA: Insufficient documentation

## 2014-01-01 DIAGNOSIS — S39012A Strain of muscle, fascia and tendon of lower back, initial encounter: Secondary | ICD-10-CM

## 2014-01-01 DIAGNOSIS — Y9389 Activity, other specified: Secondary | ICD-10-CM | POA: Insufficient documentation

## 2014-01-01 DIAGNOSIS — Y9289 Other specified places as the place of occurrence of the external cause: Secondary | ICD-10-CM | POA: Insufficient documentation

## 2014-01-01 DIAGNOSIS — IMO0002 Reserved for concepts with insufficient information to code with codable children: Secondary | ICD-10-CM | POA: Insufficient documentation

## 2014-01-01 DIAGNOSIS — Y990 Civilian activity done for income or pay: Secondary | ICD-10-CM | POA: Insufficient documentation

## 2014-01-01 DIAGNOSIS — G43909 Migraine, unspecified, not intractable, without status migrainosus: Secondary | ICD-10-CM | POA: Insufficient documentation

## 2014-01-01 DIAGNOSIS — F17200 Nicotine dependence, unspecified, uncomplicated: Secondary | ICD-10-CM | POA: Insufficient documentation

## 2014-01-01 DIAGNOSIS — X500XXA Overexertion from strenuous movement or load, initial encounter: Secondary | ICD-10-CM | POA: Insufficient documentation

## 2014-01-01 NOTE — Discharge Instructions (Signed)
Back Pain, Adult Low back pain is very common. About 1 in 5 people have back pain.The cause of low back pain is rarely dangerous. The pain often gets better over time.About half of people with a sudden onset of back pain feel better in just 2 weeks. About 8 in 10 people feel better by 6 weeks.  CAUSES Some common causes of back pain include:  Strain of the muscles or ligaments supporting the spine.  Wear and tear (degeneration) of the spinal discs.  Arthritis.  Direct injury to the back. DIAGNOSIS Most of the time, the direct cause of low back pain is not known.However, back pain can be treated effectively even when the exact cause of the pain is unknown.Answering your caregiver's questions about your overall health and symptoms is one of the most accurate ways to make sure the cause of your pain is not dangerous. If your caregiver needs more information, he or she may order lab work or imaging tests (X-rays or MRIs).However, even if imaging tests show changes in your back, this usually does not require surgery. HOME CARE INSTRUCTIONS For many people, back pain returns.Since low back pain is rarely dangerous, it is often a condition that people can learn to manageon their own.   Remain active. It is stressful on the back to sit or stand in one place. Do not sit, drive, or stand in one place for more than 30 minutes at a time. Take short walks on level surfaces as soon as pain allows.Try to increase the length of time you walk each day.  Do not stay in bed.Resting more than 1 or 2 days can delay your recovery.  Do not avoid exercise or work.Your body is made to move.It is not dangerous to be active, even though your back may hurt.Your back will likely heal faster if you return to being active before your pain is gone.  Pay attention to your body when you bend and lift. Many people have less discomfortwhen lifting if they bend their knees, keep the load close to their bodies,and  avoid twisting. Often, the most comfortable positions are those that put less stress on your recovering back.  Find a comfortable position to sleep. Use a firm mattress and lie on your side with your knees slightly bent. If you lie on your back, put a pillow under your knees.  Only take over-the-counter or prescription medicines as directed by your caregiver. Over-the-counter medicines to reduce pain and inflammation are often the most helpful.Your caregiver may prescribe muscle relaxant drugs.These medicines help dull your pain so you can more quickly return to your normal activities and healthy exercise.  Put ice on the injured area.  Put ice in a plastic bag.  Place a towel between your skin and the bag.  Leave the ice on for 15-20 minutes, 03-04 times a day for the first 2 to 3 days. After that, ice and heat may be alternated to reduce pain and spasms.  Ask your caregiver about trying back exercises and gentle massage. This may be of some benefit.  Avoid feeling anxious or stressed.Stress increases muscle tension and can worsen back pain.It is important to recognize when you are anxious or stressed and learn ways to manage it.Exercise is a great option. SEEK MEDICAL CARE IF:  You have pain that is not relieved with rest or medicine.  You have pain that does not improve in 1 week.  You have new symptoms.  You are generally not feeling well. SEEK   IMMEDIATE MEDICAL CARE IF:   You have pain that radiates from your back into your legs.  You develop new bowel or bladder control problems.  You have unusual weakness or numbness in your arms or legs.  You develop nausea or vomiting.  You develop abdominal pain.  You feel faint. Document Released: 12/13/2005 Document Revised: 06/13/2012 Document Reviewed: 05/03/2011 [employer] Patient Information 2014 [employer].  

## 2014-01-01 NOTE — ED Provider Notes (Signed)
CSN: [id number]     Arrival date & time 01/01/14  1407 History   First MD Initiated Contact with Patient 01/01/14 1741     This chart was scribed for Rolan Bucco, MD by Arlan Organ, ED Scribe. This patient was seen in room MH05/MH05 and the patient's care was started 6:43 PM.   Chief Complaint  Patient presents with  . Back Pain   Patient is a 43 y.o. male presenting with back pain. The history is provided by the patient. No language interpreter was used.  Back Pain Radiates to:  Does not radiate Pain severity:  No pain Onset quality:  Gradual Duration:  3 days Timing:  Sporadic Progression:  Resolved Chronicity:  New Relieved by:  None tried Worsened by:  Nothing tried Ineffective treatments:  None tried Associated symptoms: no abdominal pain, no chest pain, no fever, no headaches, no numbness and no weakness     HPI Comments: Jeffrey Cox is a 43 y.o. male who presents to the Emergency Department complaining of gradual onset back pain that initially started 3 days ago. Pt states he hurt his back while playing with his children. He states his daughter jumped in his arms, and he felt a "pull". He denies any chronic back pain. He denies any numbness or tingling in his lower extremities. He denies abdominal pain, fever, chills, bowel or urinary changes.  He states the pain has completely gone today, but he needs a note to go back to work.  He feels that the injury is very minor and does not feel that it will be any problem to do his job.   Past Medical History  Diagnosis Date  . Migraines    Past Surgical History  Procedure Laterality Date  . Hand surgey      right hand  . Nose surgery     No family history on file. History  Substance Use Topics  . Smoking status: Current Every Day Smoker -- 0.50 packs/day    Types: Cigarettes  . Smokeless tobacco: Not on file  . Alcohol Use: Yes    Review of Systems  Constitutional: Negative for fever, chills, diaphoresis and fatigue.   HENT: Negative for congestion, rhinorrhea and sneezing.   Eyes: Negative.   Respiratory: Negative for cough, chest tightness and shortness of breath.   Cardiovascular: Negative for chest pain and leg swelling.  Gastrointestinal: Negative for nausea, vomiting, abdominal pain, diarrhea and blood in stool.  Genitourinary: Negative for frequency, hematuria, flank pain and difficulty urinating.  Musculoskeletal: Positive for back pain. Negative for arthralgias.  Skin: Negative for rash.  Neurological: Negative for dizziness, speech difficulty, weakness, numbness and headaches.    Allergies  Review of patient's allergies indicates no known allergies.  Home Medications   Current Outpatient Rx  Name  Route  Sig  Dispense  Refill  . acetaminophen (TYLENOL) 325 MG tablet   Oral   Take 975 mg by mouth every 6 (six) hours as needed for pain.         Marland Kitchen aspirin 325 MG tablet   Oral   Take 975 mg by mouth every 6 (six) hours as needed for pain.         Marland Kitchen ibuprofen (ADVIL,MOTRIN) 200 MG tablet   Oral   Take 800 mg by mouth every 6 (six) hours as needed for pain.         . naproxen sodium (ANAPROX) 220 MG tablet   Oral   Take 440 mg by mouth  every 8 (eight) hours as needed (pain).         . SUMAtriptan (IMITREX) 100 MG tablet   Oral   Take 1 tablet (100 mg total) by mouth every 2 (two) hours as needed for migraine. May repeat in 2 hours if headache persists or recurs.   10 tablet   0   . SUMAtriptan (IMITREX) 6 MG/0.5ML SOLN injection   Subcutaneous   Inject 0.5 mLs (6 mg total) into the skin every 2 (two) hours as needed for migraine or headache. May repeat in 2 hours if headache persists or recurs.   4 vial   0    Triage Vitals: BP 123/91  Pulse 84  Temp(Src) 98.6 F (37 C) (Oral)  Resp 20  Ht 5\' 8"  (1.727 m)  Wt 250 lb (113.399 kg)  BMI 38.02 kg/m2  SpO2 99%  Physical Exam  Constitutional: He is oriented to person, place, and time. He appears well-developed and  well-nourished.  HENT:  Head: Normocephalic and atraumatic.  Eyes: Pupils are equal, round, and reactive to light.  Neck: Normal range of motion. Neck supple.  Cardiovascular: Normal rate, regular rhythm and normal heart sounds.   Pulmonary/Chest: Effort normal and breath sounds normal. No respiratory distress. He has no wheezes. He has no rales. He exhibits no tenderness.  Abdominal: Soft. Bowel sounds are normal. There is no tenderness. There is no rebound and no guarding.  Musculoskeletal: Normal range of motion. He exhibits no edema.  No pain on palpation of spine.  Normal gait.  Normal sensation/motor function.    Lymphadenopathy:    He has no cervical adenopathy.  Neurological: He is alert and oriented to person, place, and time.  Skin: Skin is warm and dry. No rash noted.  Psychiatric: He has a normal mood and affect.    ED Course  Procedures (including critical care time)  DIAGNOSTIC STUDIES: Oxygen Saturation is 99% on RA, Normal by my interpretation.    COORDINATION OF CARE: 6:42 PM- Will provide pt with a note for work. Discussed treatment plan with pt at bedside and pt agreed to plan.     Labs Review Labs Reviewed - No data to display Imaging Review No results found.  EKG Interpretation   None       MDM   1. Back strain, initial encounter    Patient presents with back pain is completely resolved. He needs a note to go back to work. He has no ongoing pain or neurologic deficits. It was likely musculoskeletal in origin.  I personally performed the services described in this documentation, which was scribed in my presence.  The recorded information has been reviewed and considered.   Rolan BuccoMelanie Jannel Lynne, MD 01/01/14 1901

## 2014-01-01 NOTE — ED Notes (Signed)
Mid back pain x 1 week. 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 His job requires a work note to come back to work.

## 2014-01-17 ENCOUNTER — Encounter (HOSPITAL_BASED_OUTPATIENT_CLINIC_OR_DEPARTMENT_OTHER): Payer: Self-pay | Admitting: Emergency Medicine

## 2014-01-17 ENCOUNTER — Emergency Department (HOSPITAL_BASED_OUTPATIENT_CLINIC_OR_DEPARTMENT_OTHER)
Admission: EM | Admit: 2014-01-17 | Discharge: 2014-01-17 | Disposition: A | Discharge status: Home / Self Care | Payer: Self-pay | Attending: Emergency Medicine | Admitting: Emergency Medicine

## 2014-01-17 DIAGNOSIS — IMO0002 Reserved for concepts with insufficient information to code with codable children: Secondary | ICD-10-CM | POA: Insufficient documentation

## 2014-01-17 DIAGNOSIS — Z7982 Long term (current) use of aspirin: Secondary | ICD-10-CM | POA: Insufficient documentation

## 2014-01-17 DIAGNOSIS — Y929 Unspecified place or not applicable: Secondary | ICD-10-CM | POA: Insufficient documentation

## 2014-01-17 DIAGNOSIS — F17200 Nicotine dependence, unspecified, uncomplicated: Secondary | ICD-10-CM | POA: Insufficient documentation

## 2014-01-17 DIAGNOSIS — Y9389 Activity, other specified: Secondary | ICD-10-CM | POA: Insufficient documentation

## 2014-01-17 DIAGNOSIS — S3992XA Unspecified injury of lower back, initial encounter: Secondary | ICD-10-CM

## 2014-01-17 DIAGNOSIS — G43909 Migraine, unspecified, not intractable, without status migrainosus: Secondary | ICD-10-CM | POA: Insufficient documentation

## 2014-01-17 DIAGNOSIS — X500XXA Overexertion from strenuous movement or load, initial encounter: Secondary | ICD-10-CM | POA: Insufficient documentation

## 2014-01-17 DIAGNOSIS — Z79899 Other long term (current) drug therapy: Secondary | ICD-10-CM | POA: Insufficient documentation

## 2014-01-17 NOTE — ED Notes (Signed)
Playing with kids picked one up and felt back tighten up still having pain and tightness

## 2014-01-17 NOTE — Discharge Instructions (Signed)
Back Exercises  Back exercises help treat and prevent back injuries. The goal is to increase your strength in your belly (abdominal) and back muscles. These exercises can also help with flexibility. Start these exercises when told by your doctor.  HOME CARE  Back exercises include:  Pelvic Tilt.  · Lie on your back with your knees bent. Tilt your pelvis until the lower part of your back is against the floor. Hold this position 5 to 10 sec. Repeat this exercise 5 to 10 times.  Knee to Chest.  · Pull 1 knee up against your chest and hold for 20 to 30 seconds. Repeat this with the other knee. This may be done with the other leg straight or bent, whichever feels better. Then, pull both knees up against your chest.  Sit-Ups or Curl-Ups.  · Bend your knees 90 degrees. Start with tilting your pelvis, and do a partial, slow sit-up. Only lift your upper half 30 to 45 degrees off the floor. Take at least 2 to 3 seonds for each sit-up. Do not do sit-ups with your knees out straight. If partial sit-ups are difficult, simply do the above but with only tightening your belly (abdominal) muscles and holding it as told.  Hip-Lift.  · Lie on your back with your knees flexed 90 degrees. Push down with your feet and shoulders as you raise your hips 2 inches off the floor. Hold for 10 seconds, repeat 5 to 10 times.  Back Arches.  · Lie on your stomach. Prop yourself up on bent elbows. Slowly press on your hands, causing an arch in your low back. Repeat 3 to 5 times.  Shoulder-Lifts.  · Lie face down with arms beside your body. Keep hips and belly pressed to floor as you slowly lift your head and shoulders off the floor.  Do not overdo your exercises. Be careful in the beginning. Exercises may cause you some mild back discomfort. If the pain lasts for more than 15 minutes, stop the exercises until you see your doctor. Improvement with exercise for back problems is slow.   Document Released: 01/15/2011 Document Revised: 03/06/2012  Document Reviewed: 10/14/2011  [employer]® Patient Information ©2014 [employer].

## 2014-01-17 NOTE — ED Provider Notes (Signed)
CSN: [id number]     Arrival date & time 01/17/14  0932 History   First MD Initiated Contact with Patient 01/17/14 1202     Chief Complaint  Patient presents with  . Back Pain    HPI Playing with kids picked one up and felt back tighten up still having pain and tightness  Past Medical History  Diagnosis Date  . Migraines    Past Surgical History  Procedure Laterality Date  . Hand surgey      right hand  . Nose surgery     History reviewed. No pertinent family history. History  Substance Use Topics  . Smoking status: Current Every Day Smoker -- 0.50 packs/day    Types: Cigarettes  . Smokeless tobacco: Not on file  . Alcohol Use: Yes    Review of Systems  All other systems reviewed and are negative.    Allergies  Review of patient's allergies indicates no known allergies.  Home Medications   Current Outpatient Rx  Name  Route  Sig  Dispense  Refill  . acetaminophen (TYLENOL) 325 MG tablet   Oral   Take 975 mg by mouth every 6 (six) hours as needed for pain.         Marland Kitchen. aspirin 325 MG tablet   Oral   Take 975 mg by mouth every 6 (six) hours as needed for pain.         Marland Kitchen. ibuprofen (ADVIL,MOTRIN) 200 MG tablet   Oral   Take 800 mg by mouth every 6 (six) hours as needed for pain.         . naproxen sodium (ANAPROX) 220 MG tablet   Oral   Take 440 mg by mouth every 8 (eight) hours as needed (pain).         . SUMAtriptan (IMITREX) 100 MG tablet   Oral   Take 1 tablet (100 mg total) by mouth every 2 (two) hours as needed for migraine. May repeat in 2 hours if headache persists or recurs.   10 tablet   0   . SUMAtriptan (IMITREX) 6 MG/0.5ML SOLN injection   Subcutaneous   Inject 0.5 mLs (6 mg total) into the skin every 2 (two) hours as needed for migraine or headache. May repeat in 2 hours if headache persists or recurs.   4 vial   0    BP 128/81  Pulse 85  Temp(Src) 98.9 F (37.2 C) (Oral)  Resp 16  Ht 5\' 8"  (1.727 m)  Wt 260 lb (117.935 kg)   BMI 39.54 kg/m2  SpO2 100% Physical Exam  Nursing note and vitals reviewed. Constitutional: He is oriented to person, place, and time. He appears well-developed and well-nourished. No distress.  HENT:  Head: Normocephalic and atraumatic.  Eyes: Pupils are equal, round, and reactive to light.  Neck: Normal range of motion.  Cardiovascular: Normal rate and intact distal pulses.   Pulmonary/Chest: No respiratory distress.  Abdominal: Normal appearance. He exhibits no distension.  Musculoskeletal:       Lumbar back: He exhibits tenderness and pain. He exhibits no bony tenderness.       Back:  Neurological: He is alert and oriented to person, place, and time. No cranial nerve deficit.  Skin: Skin is warm and dry. No rash noted.  Psychiatric: He has a normal mood and affect. His behavior is normal.    ED Course  Procedures (including critical care time) Labs Review Labs Reviewed - No data to display Imaging Review No results  found.    MDM   1. Back injury        Nelia Shi, MD 01/17/14 [phone]

## 2014-01-22 ENCOUNTER — Emergency Department (HOSPITAL_BASED_OUTPATIENT_CLINIC_OR_DEPARTMENT_OTHER)
Admission: EM | Admit: 2014-01-22 | Discharge: 2014-01-22 | Disposition: A | Discharge status: Home / Self Care | Payer: Self-pay | Attending: Emergency Medicine | Admitting: Emergency Medicine

## 2014-01-22 ENCOUNTER — Encounter (HOSPITAL_BASED_OUTPATIENT_CLINIC_OR_DEPARTMENT_OTHER): Payer: Self-pay | Admitting: Emergency Medicine

## 2014-01-22 DIAGNOSIS — Z9889 Other specified postprocedural states: Secondary | ICD-10-CM | POA: Insufficient documentation

## 2014-01-22 DIAGNOSIS — R209 Unspecified disturbances of skin sensation: Secondary | ICD-10-CM | POA: Insufficient documentation

## 2014-01-22 DIAGNOSIS — G43909 Migraine, unspecified, not intractable, without status migrainosus: Secondary | ICD-10-CM | POA: Insufficient documentation

## 2014-01-22 DIAGNOSIS — Z79899 Other long term (current) drug therapy: Secondary | ICD-10-CM | POA: Insufficient documentation

## 2014-01-22 DIAGNOSIS — F17200 Nicotine dependence, unspecified, uncomplicated: Secondary | ICD-10-CM | POA: Insufficient documentation

## 2014-01-22 MED ORDER — SUMATRIPTAN SUCCINATE 6 MG/0.5ML ~~LOC~~ SOLN
6.0000 mg | Freq: Once | SUBCUTANEOUS | Status: AC
  Administered 2014-01-22: 6 mg via SUBCUTANEOUS
  Filled 2014-01-22: qty 0.5

## 2014-01-22 MED ORDER — SUMATRIPTAN SUCCINATE 100 MG PO TABS: 100.0000 mg | ORAL_TABLET | ORAL | Status: DC | PRN

## 2014-01-22 NOTE — ED Provider Notes (Signed)
History/physical exam/procedure(s) were performed by non-physician practitioner and as supervising physician I was immediately available for consultation/collaboration. I have reviewed all notes and am in agreement with care and plan.   Jason Frisbee S Jermond Burkemper, MD 01/22/14 1549 

## 2014-01-22 NOTE — ED Provider Notes (Signed)
CSN: [id number]     Arrival date & time 01/22/14  1202 History   First MD Initiated Contact with Patient 01/22/14 1244     Chief Complaint  Patient presents with  . Headache   (Consider location/radiation/quality/duration/timing/severity/associated sxs/prior Treatment) Patient is a 43 y.o. male presenting with headaches. The history is provided by the patient. No language interpreter was used.  Headache Pain location:  Generalized Quality:  Unable to specify Radiates to:  Does not radiate Severity currently:  6/10 Severity at highest:  1/10 Onset quality:  Gradual Timing:  Constant Progression:  Worsening Chronicity:  New Similar to prior headaches: no   Relieved by:  Nothing Worsened by:  Nothing tried Ineffective treatments:  None tried Associated symptoms: paresthesias   Associated symptoms: no numbness and no vomiting     Past Medical History  Diagnosis Date  . Migraines    Past Surgical History  Procedure Laterality Date  . Hand surgey      right hand  . Nose surgery     No family history on file. History  Substance Use Topics  . Smoking status: Current Every Day Smoker -- 0.50 packs/day    Types: Cigarettes  . Smokeless tobacco: Not on file  . Alcohol Use: Yes     Comment: occasional    Review of Systems  Gastrointestinal: Negative for vomiting.  Neurological: Positive for headaches and paresthesias. Negative for numbness.  All other systems reviewed and are negative.    Allergies  Review of patient's allergies indicates no known allergies.  Home Medications   Current Outpatient Rx  Name  Route  Sig  Dispense  Refill  . acetaminophen (TYLENOL) 325 MG tablet   Oral   Take 975 mg by mouth every 6 (six) hours as needed for pain.         Marland Kitchen aspirin 325 MG tablet   Oral   Take 975 mg by mouth every 6 (six) hours as needed for pain.         Marland Kitchen ibuprofen (ADVIL,MOTRIN) 200 MG tablet   Oral   Take 800 mg by mouth every 6 (six) hours as needed for  pain.         . naproxen sodium (ANAPROX) 220 MG tablet   Oral   Take 440 mg by mouth every 8 (eight) hours as needed (pain).         . SUMAtriptan (IMITREX) 100 MG tablet   Oral   Take 1 tablet (100 mg total) by mouth every 2 (two) hours as needed for migraine. May repeat in 2 hours if headache persists or recurs.   10 tablet   0   . SUMAtriptan (IMITREX) 6 MG/0.5ML SOLN injection   Subcutaneous   Inject 0.5 mLs (6 mg total) into the skin every 2 (two) hours as needed for migraine or headache. May repeat in 2 hours if headache persists or recurs.   4 vial   0    BP 135/79  Pulse 88  Temp(Src) 98 F (36.7 C) (Oral)  Resp 20  Ht 5\' 8"  (1.727 m)  Wt 270 lb (122.471 kg)  BMI 41.06 kg/m2  SpO2 100% Physical Exam  Nursing note and vitals reviewed. Constitutional: He is oriented to person, place, and time. He appears well-developed and well-nourished.  HENT:  Head: Normocephalic.  Right Ear: External ear normal.  Left Ear: External ear normal.  Nose: Nose normal.  Mouth/Throat: Oropharynx is clear and moist.  Eyes: Conjunctivae and EOM are normal. Pupils  are equal, round, and reactive to light.  Neck: Normal range of motion. Neck supple.  Cardiovascular: Normal rate and regular rhythm.   Pulmonary/Chest: Effort normal.  Abdominal: Soft.  Musculoskeletal: Normal range of motion.  Neurological: He is alert and oriented to person, place, and time. He has normal reflexes.  Skin: Skin is warm.  Psychiatric: He has a normal mood and affect.    ED Course  Procedures (including critical care time) Labs Review Labs Reviewed - No data to display Imaging Review No results found.  EKG Interpretation   None       MDM   1. Migraine    Pt reports relief with imitrex and oxygen.      Lonia SkinnerLeslie K OwenSofia, PA-C 01/22/14 1418

## 2014-01-22 NOTE — Discharge Instructions (Signed)
Cluster Headache  Cluster headaches are recognized by their pattern of deep, intense head pain. They normally occur on one side of your head, but they may "switch sides" in subsequent episodes. Typically, cluster headaches:   · Are severe in nature.    · Occur repeatedly over weeks to months and are followed by periods of no headaches.    · Can last from 15 minutes to 3 hours.    · Occur at the same time each day, often at night.    · Occur several times a day.  CAUSES  The exact cause of cluster headaches is not known. Alcohol use may be associated with cluster headaches.  SIGNS AND SYMPTOMS   · Severe pain that begins in or around your eye or temple.    · One-sided head pain.    · Feeling sick to your stomach (nauseous).    · Sensitivity to light.    · Runny nose.    · Eye redness, tearing, and nasal stuffiness on the side of your head where you are experiencing pain.    · Sweaty, pale skin of the face.    · Droopy or swollen eyelid.    · Restlessness.  DIAGNOSIS   Cluster headaches are diagnosed based on symptoms and a physical exam. Your health care provider may order a CT scan or an MRI of your head or lab tests to see if your headaches are caused by other medical conditions.   TREATMENT   · Medicines for pain relief and to prevent recurrent attacks. Some people may need a combination of medicines.  · Oxygen for pain relief.    · Biofeedback programs to help reduce headache pain.    It may be helpful to keep a headache diary. This may help you find a trend for what is triggering your headaches. Your health care provider can develop a treatment plan.   HOME CARE INSTRUCTIONS   During cluster periods:   · Follow a regular sleep schedule. Do not vary the amount and time that you sleep from day to day. It is important to stay on the same schedule during a cluster period to help prevent headaches.    · Avoid alcohol.    · Stop smoking if you smoke.    SEEK MEDICAL CARE IF:  · You have any changes from your previous  cluster headaches either in intensity or frequency.    · You are not getting relief from medicines you are taking.    SEEK IMMEDIATE MEDICAL CARE IF:   · You faint.    · You have weakness or numbness, especially on one side of your body or face.    · You have double vision.    · You have nausea or vomiting that is not relieved within several hours.    · You cannot keep your balance or have difficulty talking or walking.    · You have neck pain or stiffness.    · You have a fever.  MAKE SURE YOU:  · Understand these instructions.    · Will watch your condition.    · Will get help right away if you are not doing well or get worse.  Document Released: 12/13/2005 Document Revised: 10/03/2013 Document Reviewed: 07/05/2013  [employer]® Patient Information ©2014 [employer].

## 2014-01-22 NOTE — ED Notes (Signed)
PA at bedside.

## 2014-01-22 NOTE — ED Notes (Signed)
Headache that started yesterday.

## 2014-03-01 ENCOUNTER — Encounter (HOSPITAL_BASED_OUTPATIENT_CLINIC_OR_DEPARTMENT_OTHER): Payer: Self-pay | Admitting: Emergency Medicine

## 2014-03-01 ENCOUNTER — Emergency Department (HOSPITAL_BASED_OUTPATIENT_CLINIC_OR_DEPARTMENT_OTHER)
Admission: EM | Admit: 2014-03-01 | Discharge: 2014-03-01 | Disposition: A | Discharge status: Home / Self Care | Payer: Self-pay | Attending: Emergency Medicine | Admitting: Emergency Medicine

## 2014-03-01 DIAGNOSIS — Z87898 Personal history of other specified conditions: Secondary | ICD-10-CM

## 2014-03-01 DIAGNOSIS — Z0289 Encounter for other administrative examinations: Secondary | ICD-10-CM | POA: Insufficient documentation

## 2014-03-01 DIAGNOSIS — G43909 Migraine, unspecified, not intractable, without status migrainosus: Secondary | ICD-10-CM | POA: Insufficient documentation

## 2014-03-01 DIAGNOSIS — F17200 Nicotine dependence, unspecified, uncomplicated: Secondary | ICD-10-CM | POA: Insufficient documentation

## 2014-03-01 DIAGNOSIS — Z79899 Other long term (current) drug therapy: Secondary | ICD-10-CM | POA: Insufficient documentation

## 2014-03-01 NOTE — ED Notes (Signed)
Pt sts he missed work this week due to headache and cannot return to work with a note stating he is ok. Pt reports h/o cluster headaches, denies complaints today.

## 2014-03-01 NOTE — ED Provider Notes (Signed)
CSN: [id number]     Arrival date & time 03/01/14  1003 History   First MD Initiated Contact with Patient 03/01/14 1008     Chief Complaint  Patient presents with  . return to work note       HPI Pt is here for a "return to work note" He reports he has been suffering from headaches recently and was unable to work He is now back to baseline and wants to return to work but needs note  Denies current HA/vomitng/weakness Past Medical History  Diagnosis Date  . Migraines    Past Surgical History  Procedure Laterality Date  . Hand surgey      right hand  . Nose surgery     No family history on file. History  Substance Use Topics  . Smoking status: Current Every Day Smoker -- 0.50 packs/day    Types: Cigarettes  . Smokeless tobacco: Not on file  . Alcohol Use: Yes     Comment: occasional    Review of Systems  Constitutional: Negative for fever.  Gastrointestinal: Negative for vomiting.  Neurological: Negative for weakness.      Allergies  Review of patient's allergies indicates no known allergies.  Home Medications   Current Outpatient Rx  Name  Route  Sig  Dispense  Refill  . acetaminophen (TYLENOL) 325 MG tablet   Oral   Take 975 mg by mouth every 6 (six) hours as needed for pain.         Marland Kitchen. aspirin 325 MG tablet   Oral   Take 975 mg by mouth every 6 (six) hours as needed for pain.         . SUMAtriptan (IMITREX) 100 MG tablet   Oral   Take 1 tablet (100 mg total) by mouth every 2 (two) hours as needed for migraine. May repeat in 2 hours if headache persists or recurs.   10 tablet   0   . SUMAtriptan (IMITREX) 6 MG/0.5ML SOLN injection   Subcutaneous   Inject 0.5 mLs (6 mg total) into the skin every 2 (two) hours as needed for migraine or headache. May repeat in 2 hours if headache persists or recurs.   4 vial   0    BP 124/76  Pulse 100  Temp(Src) 98.7 F (37.1 C) (Oral)  Resp 18  Ht 5\' 8"  (1.727 m)  Wt 270 lb (122.471 kg)  BMI 41.06 kg/m2   SpO2 98% Physical Exam CONSTITUTIONAL: Well developed/well nourished, pt texting on phone when I enter HEAD: Normocephalic/atraumatic EYES: EOMI ENMT: Mucous membranes moist NECK: supple no meningeal signs CV: S1/S2 noted, no murmurs/rubs/gallops noted LUNGS: Lungs are clear to auscultation bilaterally, no apparent distress NEURO: Pt is awake/alert, moves all extremitiesx4, no arm drift, no facial droop, no ataxia SKIN: warm, color normal PSYCH: no abnormalities of mood noted  ED Course  Procedures  MDM   Final diagnoses:  History of headache    Nursing notes including past medical history and social history reviewed and considered in documentation     Joya Gaskinsonald W Keziah Avis, MD 03/01/14 1127

## 2014-03-01 NOTE — Discharge Instructions (Signed)

## 2014-03-01 NOTE — ED Notes (Signed)
Pt states he is only here for a work note and does not have any complaints.

## 2014-03-30 ENCOUNTER — Emergency Department (HOSPITAL_COMMUNITY)
Admission: EM | Admit: 2014-03-30 | Discharge: 2014-03-30 | Disposition: A | Discharge status: Home / Self Care | Payer: Self-pay | Attending: Emergency Medicine | Admitting: Emergency Medicine

## 2014-03-30 ENCOUNTER — Encounter (HOSPITAL_COMMUNITY): Payer: Self-pay | Admitting: Emergency Medicine

## 2014-03-30 DIAGNOSIS — F17200 Nicotine dependence, unspecified, uncomplicated: Secondary | ICD-10-CM | POA: Insufficient documentation

## 2014-03-30 DIAGNOSIS — R42 Dizziness and giddiness: Secondary | ICD-10-CM | POA: Insufficient documentation

## 2014-03-30 DIAGNOSIS — G43909 Migraine, unspecified, not intractable, without status migrainosus: Secondary | ICD-10-CM | POA: Insufficient documentation

## 2014-03-30 MED ORDER — SUMATRIPTAN SUCCINATE 50 MG PO TABS: 50.0000 mg | ORAL_TABLET | ORAL | Status: DC | PRN

## 2014-03-30 MED ORDER — SUMATRIPTAN SUCCINATE 6 MG/0.5ML ~~LOC~~ SOLN
6.0000 mg | Freq: Once | SUBCUTANEOUS | Status: AC
  Administered 2014-03-30: 6 mg via SUBCUTANEOUS
  Filled 2014-03-30: qty 0.5

## 2014-03-30 NOTE — ED Notes (Signed)
Patient reports having a migraine that started Wednesday at work.  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  He reports he took Motrin and Aspirin for pain management at work yesterday that didn't help.

## 2014-03-30 NOTE — ED Provider Notes (Signed)
CSN: [id number]     Arrival date & time 03/30/14  0620 History   First MD Initiated Contact with Patient 03/30/14 0715     Chief Complaint  Patient presents with  . Migraine     (Consider location/radiation/quality/duration/timing/severity/associated sxs/prior Treatment) HPI Comments: Patient is a 43 year old male with history of migraines who presents to the ED today for migraine. He reports his headache has gradually worsened over the past 3 day. It is a sharp pain that begins at the back of his head and radiates up his skull to the front of his head. It is worse on the left side. The headache is associated with the sensation that the room is spinning and photophobia. This is typical of his cluster headaches which he has been diagnosed with in the past. He has taken ibuprofen, Excedrin, and tylenol without relief. He has been referred to neurology in the past, but has not made an appointment to see them. He denies visual disturbance, nausea, vomiting, parathesias, speech difficulty.   The history is provided by the patient. No language interpreter was used.    Past Medical History  Diagnosis Date  . Migraines    Past Surgical History  Procedure Laterality Date  . Hand surgey      right hand  . Nose surgery     History reviewed. No pertinent family history. History  Substance Use Topics  . Smoking status: Current Every Day Smoker -- 0.50 packs/day for 15 years    Types: Cigarettes  . Smokeless tobacco: Not on file  . Alcohol Use: Yes     Comment: occasional    Review of Systems  Constitutional: Negative for fever and chills.  Eyes: Positive for photophobia. Negative for visual disturbance.  Respiratory: Negative for shortness of breath.   Cardiovascular: Negative for chest pain.  Gastrointestinal: Negative for nausea, vomiting and abdominal pain.  Musculoskeletal: Negative for neck pain.  Neurological: Positive for dizziness and headaches.  All other systems reviewed and  are negative.      Allergies  Review of patient's allergies indicates no known allergies.  Home Medications   Current Outpatient Rx  Name  Route  Sig  Dispense  Refill  . acetaminophen (TYLENOL) 325 MG tablet   Oral   Take 975 mg by mouth every 6 (six) hours as needed for headache.          Marland Kitchen. aspirin-acetaminophen-caffeine (EXCEDRIN MIGRAINE) 250-250-65 MG per tablet   Oral   Take 2 tablets by mouth every 6 (six) hours as needed for headache or migraine.         Marland Kitchen. ibuprofen (ADVIL,MOTRIN) 200 MG tablet   Oral   Take 800-1,000 mg by mouth every 6 (six) hours as needed for headache.          BP 117/76  Pulse 77  Temp(Src) 97.5 F (36.4 C) (Oral)  Resp 18  Ht 5\' 7"  (1.702 m)  Wt 250 lb (113.399 kg)  BMI 39.15 kg/m2  SpO2 100% Physical Exam  Nursing note and vitals reviewed. Constitutional: He is oriented to person, place, and time. He appears well-developed and well-nourished. No distress.  HENT:  Head: Normocephalic and atraumatic.  Right Ear: External ear normal.  Left Ear: External ear normal.  Nose: Nose normal.  Mouth/Throat: Uvula is midline.  No temporal artery tenderness  Eyes: Conjunctivae and EOM are normal. Pupils are equal, round, and reactive to light. Right eye exhibits no nystagmus. Left eye exhibits no nystagmus.  Neck: Normal  range of motion. No tracheal deviation present.  No nuchal rigidity or meningeal signs  Cardiovascular: Normal rate, regular rhythm, normal heart sounds, intact distal pulses and normal pulses.   Pulses:      Radial pulses are 2+ on the right side, and 2+ on the left side.       Posterior tibial pulses are 2+ on the right side, and 2+ on the left side.  Pulmonary/Chest: Effort normal and breath sounds normal. No stridor.  Abdominal: Soft. He exhibits no distension. There is no tenderness.  Musculoskeletal: Normal range of motion.  Neurological: He is alert and oriented to person, place, and time. He has normal strength.  No sensory deficit. Coordination and gait normal. GCS eye subscore is 4. GCS verbal subscore is 5. GCS motor subscore is 6.  Finger nose finger normal. No facial droop. Grip strength 5/5 bilaterally.  Strength 5/5 in all extremities, tested against resistance.  Gait is normal, not antalgic or ataxic.   Skin: Skin is warm and dry. He is not diaphoretic.  Psychiatric: He has a normal mood and affect. His behavior is normal.    ED Course  Procedures (including critical care time) Labs Review Labs Reviewed - No data to display Imaging Review No results found.   EKG Interpretation None      MDM   Final diagnoses:  Migraine    Pt HA treated and improved while in ED.  Presentation is like pts typical HA and non concerning for [facility], [facility], Meningitis, stroke, or temporal arteritis. Pt is afebrile with no focal neuro deficits, nuchal rigidity, or change in vision. Pt is to follow up with PCP to discuss prophylactic medication. Pt verbalizes understanding and is agreeable with plan to dc.      Mora Bellman, PA-C 03/30/14 [phone]

## 2014-03-30 NOTE — Discharge Instructions (Signed)
Cluster Headache Cluster headaches are recognized by their pattern of deep, intense head pain. They normally occur on one side of your head, but they may "switch sides" in subsequent episodes. Typically, cluster headaches:   Are severe in nature.   Occur repeatedly over weeks to months and are followed by periods of no headaches.   Can last from 15 minutes to 3 hours.   Occur at the same time each day, often at night.   Occur several times a day. CAUSES The exact cause of cluster headaches is not known. Alcohol use may be associated with cluster headaches. SIGNS AND SYMPTOMS   Severe pain that begins in or around your eye or temple.   One-sided head pain.   Feeling sick to your stomach (nauseous).   Sensitivity to light.   Runny nose.   Eye redness, tearing, and nasal stuffiness on the side of your head where you are experiencing pain.   Sweaty, pale skin of the face.   Droopy or swollen eyelid.   Restlessness. DIAGNOSIS  Cluster headaches are diagnosed based on symptoms and a physical exam. Your health care provider may order a CT scan or an MRI of your head or lab tests to see if your headaches are caused by other medical conditions.  TREATMENT   Medicines for pain relief and to prevent recurrent attacks. Some people may need a combination of medicines.  Oxygen for pain relief.   Biofeedback programs to help reduce headache pain.  It may be helpful to keep a headache diary. This may help you find a trend for what is triggering your headaches. Your health care provider can develop a treatment plan.  HOME CARE INSTRUCTIONS  During cluster periods:   Follow a regular sleep schedule. Do not vary the amount and time that you sleep from day to day. It is important to stay on the same schedule during a cluster period to help prevent headaches.   Avoid alcohol.   Stop smoking if you smoke.  SEEK MEDICAL CARE IF:  You have any changes from your previous  cluster headaches either in intensity or frequency.   You are not getting relief from medicines you are taking.  SEEK IMMEDIATE MEDICAL CARE IF:   You faint.   You have weakness or numbness, especially on one side of your body or face.   You have double vision.   You have nausea or vomiting that is not relieved within several hours.   You cannot keep your balance or have difficulty talking or walking.   You have neck pain or stiffness.   You have a fever. MAKE SURE YOU:  Understand these instructions.   Will watch your condition.   Will get help right away if you are not doing well or get worse. Document Released: 12/13/2005 Document Revised: 10/03/2013 Document Reviewed: 07/05/2013 [facility] Patient Information 2014 [employer] [place].  Migraine Headache A migraine headache is an intense, throbbing pain on one or both sides of your head. A migraine can last for 30 minutes to several hours. CAUSES  The exact cause of a migraine headache is not always known. However, a migraine may be caused when nerves in the brain become irritated and release chemicals that cause inflammation. This causes pain. Certain things may also trigger migraines, such as:  Alcohol.  Smoking.  Stress.  Menstruation.  Aged cheeses.  Foods or drinks that contain nitrates, glutamate, aspartame, or tyramine.  Lack of sleep.  Chocolate.  Caffeine.  Hunger.  Physical exertion.  Fatigue.  Medicines used to treat chest pain (nitroglycerine), birth control pills, estrogen, and some blood pressure medicines. SIGNS AND SYMPTOMS  Pain on one or both sides of your head.  Pulsating or throbbing pain.  Severe pain that prevents daily activities.  Pain that is aggravated by any physical activity.  Nausea, vomiting, or both.  Dizziness.  Pain with exposure to bright lights, loud noises, or activity.  General sensitivity to bright lights, loud noises, or smells. Before you get a  migraine, you may get warning signs that a migraine is coming (aura). An aura may include:  Seeing flashing lights.  Seeing bright spots, halos, or zig-zag lines.  Having tunnel vision or blurred vision.  Having feelings of numbness or tingling.  Having trouble talking.  Having muscle weakness. DIAGNOSIS  A migraine headache is often diagnosed based on:  Symptoms.  Physical exam.  A CT scan or MRI of your head. These imaging tests cannot diagnose migraines, but they can help rule out other causes of headaches. TREATMENT Medicines may be given for pain and nausea. Medicines can also be given to help prevent recurrent migraines.  HOME CARE INSTRUCTIONS  Only take over-the-counter or prescription medicines for pain or discomfort as directed by your health care provider. The use of long-term narcotics is not recommended.  Lie down in a dark, quiet room when you have a migraine.  Keep a journal to find out what may trigger your migraine headaches. For example, write down:  What you eat and drink.  How much sleep you get.  Any change to your diet or medicines.  Limit alcohol consumption.  Quit smoking if you smoke.  Get 7 9 hours of sleep, or as recommended by your health care provider.  Limit stress.  Keep lights dim if bright lights bother you and make your migraines worse. SEEK IMMEDIATE MEDICAL CARE IF:   Your migraine becomes severe.  You have a fever.  You have a stiff neck.  You have vision loss.  You have muscular weakness or loss of muscle control.  You start losing your balance or have trouble walking.  You feel faint or pass out.  You have severe symptoms that are different from your first symptoms. MAKE SURE YOU:   Understand these instructions.  Will watch your condition.  Will get help right away if you are not doing well or get worse. Document Released: 12/13/2005 Document Revised: 10/03/2013 Document Reviewed: 08/20/2013 [facility]  Patient Information 2014 [place], [place].  Headaches, Frequently Asked Questions MIGRAINE HEADACHES Q: What is migraine? What causes it? How can I treat it? A: Generally, migraine headaches begin as a dull ache. Then they develop into a constant, throbbing, and pulsating pain. You may experience pain at the temples. You may experience pain at the front or back of one or both sides of the head. The pain is usually accompanied by a combination of:  Nausea.  Vomiting.  Sensitivity to light and noise. Some people (about 15%) experience an aura (see below) before an attack. The cause of migraine is believed to be chemical reactions in the brain. Treatment for migraine may include over-the-counter or prescription medications. It may also include self-help techniques. These include relaxation training and biofeedback.  Q: What is an aura? A: About 15% of people with migraine get an "aura". This is a sign of neurological symptoms that occur before a migraine headache. You may see wavy or jagged lines, dots, or flashing lights. You might experience tunnel vision or blind  spots in one or both eyes. The aura can include visual or auditory hallucinations (something imagined). It may include disruptions in smell (such as strange odors), taste or touch. Other symptoms include:  Numbness.  A "pins and needles" sensation.  Difficulty in recalling or speaking the correct word. These neurological events may last as long as 60 minutes. These symptoms will fade as the headache begins. Q: What is a trigger? A: Certain physical or environmental factors can lead to or "trigger" a migraine. These include:  Foods.  Hormonal changes.  Weather.  Stress. It is important to remember that triggers are different for everyone. To help prevent migraine attacks, you need to figure out which triggers affect you. Keep a headache diary. This is a good way to track triggers. The diary will help you talk to your healthcare  professional about your condition. Q: Does weather affect migraines? A: Bright sunshine, hot, humid conditions, and drastic changes in barometric pressure may lead to, or "trigger," a migraine attack in some people. But studies have shown that weather does not act as a trigger for everyone with migraines. Q: What is the link between migraine and hormones? A: Hormones start and regulate many of your body's functions. Hormones keep your body in balance within a constantly changing environment. The levels of hormones in your body are unbalanced at times. Examples are during menstruation, pregnancy, or menopause. That can lead to a migraine attack. In fact, about three quarters of all women with migraine report that their attacks are related to the menstrual cycle.  Q: Is there an increased risk of stroke for migraine sufferers? A: The likelihood of a migraine attack causing a stroke is very remote. That is not to say that migraine sufferers cannot have a stroke associated with their migraines. In persons under age 68, the most common associated factor for stroke is migraine headache. But over the course of a person's normal life span, the occurrence of migraine headache may actually be associated with a reduced risk of dying from cerebrovascular disease due to stroke.  Q: What are acute medications for migraine? A: Acute medications are used to treat the pain of the headache after it has started. Examples over-the-counter medications, NSAIDs, ergots, and triptans.  Q: What are the triptans? A: Triptans are the newest class of abortive medications. They are specifically targeted to treat migraine. Triptans are vasoconstrictors. They moderate some chemical reactions in the brain. The triptans work on receptors in your brain. Triptans help to restore the balance of a neurotransmitter called serotonin. Fluctuations in levels of serotonin are thought to be a main cause of migraine.  Q: Are over-the-counter  medications for migraine effective? A: Over-the-counter, or "OTC," medications may be effective in relieving mild to moderate pain and associated symptoms of migraine. But you should see your caregiver before beginning any treatment regimen for migraine.  Q: What are preventive medications for migraine? A: Preventive medications for migraine are sometimes referred to as "prophylactic" treatments. They are used to reduce the frequency, severity, and length of migraine attacks. Examples of preventive medications include antiepileptic medications, antidepressants, beta-blockers, calcium channel blockers, and NSAIDs (nonsteroidal anti-inflammatory drugs). Q: Why are anticonvulsants used to treat migraine? A: During the past few years, there has been an increased interest in antiepileptic drugs for the prevention of migraine. They are sometimes referred to as "anticonvulsants". Both epilepsy and migraine may be caused by similar reactions in the brain.  Q: Why are antidepressants used to treat migraine? A: Antidepressants  are typically used to treat people with depression. They may reduce migraine frequency by regulating chemical levels, such as serotonin, in the brain.  Q: What alternative therapies are used to treat migraine? A: The term "alternative therapies" is often used to describe treatments considered outside the scope of conventional Western medicine. Examples of alternative therapy include acupuncture, acupressure, and yoga. Another common alternative treatment is herbal therapy. Some herbs are believed to relieve headache pain. Always discuss alternative therapies with your caregiver before proceeding. Some herbal products contain arsenic and other toxins. TENSION HEADACHES Q: What is a tension-type headache? What causes it? How can I treat it? A: Tension-type headaches occur randomly. They are often the result of temporary stress, anxiety, fatigue, or anger. Symptoms include soreness in your  temples, a tightening band-like sensation around your head (a "vice-like" ache). Symptoms can also include a pulling feeling, pressure sensations, and contracting head and neck muscles. The headache begins in your forehead, temples, or the back of your head and neck. Treatment for tension-type headache may include over-the-counter or prescription medications. Treatment may also include self-help techniques such as relaxation training and biofeedback. CLUSTER HEADACHES Q: What is a cluster headache? What causes it? How can I treat it? A: Cluster headache gets its name because the attacks come in groups. The pain arrives with little, if any, warning. It is usually on one side of the head. A tearing or bloodshot eye and a runny nose on the same side of the headache may also accompany the pain. Cluster headaches are believed to be caused by chemical reactions in the brain. They have been described as the most severe and intense of any headache type. Treatment for cluster headache includes prescription medication and oxygen. SINUS HEADACHES Q: What is a sinus headache? What causes it? How can I treat it? A: When a cavity in the bones of the face and skull (a sinus) becomes inflamed, the inflammation will cause localized pain. This condition is usually the result of an allergic reaction, a tumor, or an infection. If your headache is caused by a sinus blockage, such as an infection, you will probably have a fever. An x-ray will confirm a sinus blockage. Your caregiver's treatment might include antibiotics for the infection, as well as antihistamines or decongestants.  REBOUND HEADACHES Q: What is a rebound headache? What causes it? How can I treat it? A: A pattern of taking acute headache medications too often can lead to a condition known as "rebound headache." A pattern of taking too much headache medication includes taking it more than 2 days per week or in excessive amounts. That means more than the label or a  caregiver advises. With rebound headaches, your medications not only stop relieving pain, they actually begin to cause headaches. Doctors treat rebound headache by tapering the medication that is being overused. Sometimes your caregiver will gradually substitute a different type of treatment or medication. Stopping may be a challenge. Regularly overusing a medication increases the potential for serious side effects. Consult a caregiver if you regularly use headache medications more than 2 days per week or more than the label advises. ADDITIONAL QUESTIONS AND ANSWERS Q: What is biofeedback? A: Biofeedback is a self-help treatment. Biofeedback uses special equipment to monitor your body's involuntary physical responses. Biofeedback monitors:  Breathing.  Pulse.  Heart rate.  Temperature.  Muscle tension.  Brain activity. Biofeedback helps you refine and perfect your relaxation exercises. You learn to control the physical responses that are related to stress.  Once the technique has been mastered, you do not need the equipment any more. Q: Are headaches hereditary? A: Four out of five (80%) of people that suffer report a family history of migraine. Scientists are not sure if this is genetic or a family predisposition. Despite the uncertainty, a child has a 50% chance of having migraine if one parent suffers. The child has a 75% chance if both parents suffer.  Q: Can children get headaches? A: By the time they reach high school, most young people have experienced some type of headache. Many safe and effective approaches or medications can prevent a headache from occurring or stop it after it has begun.  Q: What type of doctor should I see to diagnose and treat my headache? A: Start with your primary caregiver. Discuss his or her experience and approach to headaches. Discuss methods of classification, diagnosis, and treatment. Your caregiver may decide to recommend you to a headache specialist,  depending upon your symptoms or other physical conditions. Having diabetes, allergies, etc., may require a more comprehensive and inclusive approach to your headache. The National [employer] Foundation will provide, upon request, a list of [facility] physician members in your state. Document Released: 03/04/2004 Document Revised: 03/06/2012 Document Reviewed: 08/12/2008 [facility] Patient Information 2014 [place], [place].

## 2014-03-30 NOTE — ED Provider Notes (Signed)
Medical screening examination/treatment/procedure(s) were performed by non-physician practitioner and as supervising physician I was immediately available for consultation/collaboration.   EKG Interpretation None       Ethelda ChickMartha K Linker, MD 03/30/14 [phone]

## 2014-08-14 ENCOUNTER — Emergency Department (HOSPITAL_BASED_OUTPATIENT_CLINIC_OR_DEPARTMENT_OTHER)
Admission: EM | Admit: 2014-08-14 | Discharge: 2014-08-14 | Disposition: A | Discharge status: Home / Self Care | Payer: Self-pay | Attending: Emergency Medicine | Admitting: Emergency Medicine

## 2014-08-14 ENCOUNTER — Encounter (HOSPITAL_BASED_OUTPATIENT_CLINIC_OR_DEPARTMENT_OTHER): Payer: Self-pay | Admitting: Emergency Medicine

## 2014-08-14 DIAGNOSIS — G43909 Migraine, unspecified, not intractable, without status migrainosus: Secondary | ICD-10-CM | POA: Insufficient documentation

## 2014-08-14 DIAGNOSIS — R519 Headache, unspecified: Secondary | ICD-10-CM

## 2014-08-14 DIAGNOSIS — R51 Headache: Secondary | ICD-10-CM | POA: Insufficient documentation

## 2014-08-14 DIAGNOSIS — F17200 Nicotine dependence, unspecified, uncomplicated: Secondary | ICD-10-CM | POA: Insufficient documentation

## 2014-08-14 DIAGNOSIS — Z79899 Other long term (current) drug therapy: Secondary | ICD-10-CM | POA: Insufficient documentation

## 2014-08-14 MED ORDER — DIPHENHYDRAMINE HCL 50 MG/ML IJ SOLN
25.0000 mg | Freq: Once | INTRAMUSCULAR | Status: AC
  Administered 2014-08-14: 25 mg via INTRAMUSCULAR
  Filled 2014-08-14: qty 1

## 2014-08-14 MED ORDER — METOCLOPRAMIDE HCL 5 MG/ML IJ SOLN
10.0000 mg | Freq: Once | INTRAMUSCULAR | Status: AC
  Administered 2014-08-14: 10 mg via INTRAMUSCULAR
  Filled 2014-08-14: qty 2

## 2014-08-14 MED ORDER — KETOROLAC TROMETHAMINE 30 MG/ML IJ SOLN
60.0000 mg | Freq: Once | INTRAMUSCULAR | Status: AC
  Administered 2014-08-14: 60 mg via INTRAMUSCULAR
  Filled 2014-08-14: qty 2

## 2014-08-14 NOTE — Discharge Instructions (Signed)

## 2014-08-14 NOTE — ED Notes (Signed)
Reports 3 days of HA. Sts dx of cluster headaches previously. Photosensitivity, no nausea or vomiting

## 2014-08-14 NOTE — ED Provider Notes (Signed)
CSN: [id number]     Arrival date & time 08/14/14  1657 History   First MD Initiated Contact with Patient 08/14/14 1729     Chief Complaint  Patient presents with  . Headache     (Consider location/radiation/quality/duration/timing/severity/associated sxs/prior Treatment) HPI Comments: Reports headache times 3 days. Has had some photosensitivity. Similar to previous headaches. Pt states that usually otc medications work but he was not getting relief this time. Denies fever, neck pain.  The history is provided by the patient. No language interpreter was used.    Past Medical History  Diagnosis Date  . Migraines    Past Surgical History  Procedure Laterality Date  . Hand surgey      right hand  . Nose surgery     No family history on file. History  Substance Use Topics  . Smoking status: Current Every Day Smoker -- 0.50 packs/day for 15 years    Types: Cigarettes  . Smokeless tobacco: Not on file  . Alcohol Use: Yes     Comment: occasional    Review of Systems  Constitutional: Negative.  Negative for fever.  Respiratory: Negative.   Cardiovascular: Negative.       Allergies  Review of patient's allergies indicates no known allergies.  Home Medications   Prior to Admission medications   Medication Sig Start Date End Date Taking? Authorizing Provider  acetaminophen (TYLENOL) 325 MG tablet Take 975 mg by mouth every 6 (six) hours as needed for headache.     Historical Provider, MD  aspirin-acetaminophen-caffeine (EXCEDRIN MIGRAINE) [phone] MG per tablet Take 2 tablets by mouth every 6 (six) hours as needed for headache or migraine.    Historical Provider, MD  ibuprofen (ADVIL,MOTRIN) 200 MG tablet Take 800-1,000 mg by mouth every 6 (six) hours as needed for headache.    Historical Provider, MD  SUMAtriptan (IMITREX) 50 MG tablet Take 1 tablet (50 mg total) by mouth every 2 (two) hours as needed for migraine or headache. May repeat in 2 hours if headache persists or  recurs. 03/30/14   Mora BellmanHannah S Merrell, PA-C   BP 128/75  Pulse 88  Temp(Src) 98.1 F (36.7 C) (Oral)  Resp 18  Ht 5\' 7"  (1.702 m)  Wt 250 lb (113.399 kg)  BMI 39.15 kg/m2  SpO2 98% Physical Exam  Nursing note and vitals reviewed. Constitutional: He is oriented to person, place, and time. He appears well-developed and well-nourished.  HENT:  Head: Normocephalic and atraumatic.  Right Ear: External ear normal.  Left Ear: External ear normal.  Eyes: Conjunctivae and EOM are normal. Pupils are equal, round, and reactive to light.  Neck: Normal range of motion. Neck supple. No thyromegaly present.  Cardiovascular: Normal rate and regular rhythm.   Pulmonary/Chest: Effort normal and breath sounds normal.  Musculoskeletal: Normal range of motion.  Neurological: He is alert and oriented to person, place, and time.    ED Course  Procedures (including critical care time) Labs Review Labs Reviewed - No data to display  Imaging Review No results found.   EKG Interpretation None      MDM   Final diagnoses:  Headache, unspecified headache type    No red flags with headache. Pt pain 1 and pt is ready to go home. Neurologically intact. No fevers. Pt okay to go home at this time    Teressa LowerVrinda Jamarrion Budai, NP 08/14/14 1901

## 2014-08-14 NOTE — ED Provider Notes (Signed)
Medical screening examination/treatment/procedure(s) were performed by non-physician practitioner and as supervising physician I was immediately available for consultation/collaboration.  Megan Docherty, MD 08/14/14 2319 

## 2014-08-17 ENCOUNTER — Encounter (HOSPITAL_COMMUNITY): Payer: Self-pay | Admitting: Emergency Medicine

## 2014-08-17 ENCOUNTER — Emergency Department (HOSPITAL_COMMUNITY)
Admission: EM | Admit: 2014-08-17 | Discharge: 2014-08-17 | Disposition: A | Discharge status: Home / Self Care | Payer: Self-pay | Attending: Emergency Medicine | Admitting: Emergency Medicine

## 2014-08-17 DIAGNOSIS — R51 Headache: Secondary | ICD-10-CM | POA: Insufficient documentation

## 2014-08-17 DIAGNOSIS — Z8679 Personal history of other diseases of the circulatory system: Secondary | ICD-10-CM | POA: Insufficient documentation

## 2014-08-17 DIAGNOSIS — F17200 Nicotine dependence, unspecified, uncomplicated: Secondary | ICD-10-CM | POA: Insufficient documentation

## 2014-08-17 DIAGNOSIS — R519 Headache, unspecified: Secondary | ICD-10-CM

## 2014-08-17 DIAGNOSIS — Z9889 Other specified postprocedural states: Secondary | ICD-10-CM | POA: Insufficient documentation

## 2014-08-17 MED ORDER — DIPHENHYDRAMINE HCL 50 MG/ML IJ SOLN
25.0000 mg | Freq: Once | INTRAMUSCULAR | Status: AC
  Administered 2014-08-17: 25 mg via INTRAVENOUS
  Filled 2014-08-17: qty 1

## 2014-08-17 MED ORDER — METOCLOPRAMIDE HCL 5 MG/ML IJ SOLN
10.0000 mg | Freq: Once | INTRAMUSCULAR | Status: AC
  Administered 2014-08-17: 10 mg via INTRAVENOUS
  Filled 2014-08-17: qty 2

## 2014-08-17 MED ORDER — SODIUM CHLORIDE 0.9 % IV SOLN: Freq: Once | INTRAVENOUS | Status: DC

## 2014-08-17 MED ORDER — SUMATRIPTAN SUCCINATE 100 MG PO TABS: 100.0000 mg | ORAL_TABLET | ORAL | Status: DC | PRN

## 2014-08-17 MED ORDER — KETOROLAC TROMETHAMINE 30 MG/ML IJ SOLN
30.0000 mg | Freq: Once | INTRAMUSCULAR | Status: AC
  Administered 2014-08-17: 30 mg via INTRAVENOUS
  Filled 2014-08-17: qty 1

## 2014-08-17 NOTE — ED Notes (Signed)
Medications given IM per VO from GolfSofia Leslie, [place] due to pt refusing IV.

## 2014-08-17 NOTE — Discharge Instructions (Signed)

## 2014-08-17 NOTE — ED Provider Notes (Signed)
CSN: [id number]     Arrival date & time 08/17/14  0711 History   First MD Initiated Contact with Patient 08/17/14 [phone]     Chief Complaint  Patient presents with  . Headache     (Consider location/radiation/quality/duration/timing/severity/associated sxs/prior Treatment) Patient is a 43 y.o. male presenting with headaches. The history is provided by the patient. No language interpreter was used.  Headache Pain location:  Generalized Quality:  Sharp Radiates to:  Does not radiate Severity currently:  7/10 Severity at highest:  7/10 Onset quality:  Gradual Duration:  1 week Timing:  Constant Progression:  Worsening Chronicity:  New Similar to prior headaches: no   Context: activity   Relieved by:  Nothing Worsened by:  Nothing tried Ineffective treatments:  None tried Associated symptoms: no numbness     Past Medical History  Diagnosis Date  . Migraines    Past Surgical History  Procedure Laterality Date  . Hand surgey      right hand  . Nose surgery     No family history on file. History  Substance Use Topics  . Smoking status: Current Every Day Smoker -- 0.50 packs/day for 15 years    Types: Cigarettes  . Smokeless tobacco: Not on file  . Alcohol Use: Yes     Comment: occasional    Review of Systems  Neurological: Positive for headaches. Negative for numbness.  All other systems reviewed and are negative.     Allergies  Review of patient's allergies indicates no known allergies.  Home Medications   Prior to Admission medications   Medication Sig Start Date End Date Taking? Authorizing Provider  acetaminophen (TYLENOL) 325 MG tablet Take 975 mg by mouth every 6 (six) hours as needed for headache.    Yes Historical Provider, MD  aspirin-acetaminophen-caffeine (EXCEDRIN MIGRAINE) [phone] MG per tablet Take 2 tablets by mouth every 6 (six) hours as needed for headache or migraine.   Yes Historical Provider, MD  ibuprofen (ADVIL,MOTRIN) 200 MG tablet  Take 800-1,000 mg by mouth every 6 (six) hours as needed for headache.   Yes Historical Provider, MD   BP 152/85  Pulse 89  Temp(Src) 98.1 F (36.7 C) (Oral)  Resp 18  SpO2 100% Physical Exam  Nursing note and vitals reviewed. Constitutional: He appears well-developed and well-nourished.  HENT:  Head: Normocephalic.  Right Ear: External ear normal.  Left Ear: External ear normal.  Nose: Nose normal.  Mouth/Throat: Oropharynx is clear and moist.  Eyes: Pupils are equal, round, and reactive to light.  Neck: Normal range of motion.  Cardiovascular: Normal rate and regular rhythm.   Pulmonary/Chest: Effort normal and breath sounds normal.  Abdominal: Soft. Bowel sounds are normal.  Musculoskeletal: Normal range of motion.  Neurological: He is alert.  Skin: Skin is warm.  Psychiatric: He has a normal mood and affect.    ED Course  Procedures (including critical care time) Labs Review Labs Reviewed - No data to display  Imaging Review No results found.   EKG Interpretation None      MDM   Final diagnoses:  Headache disorder    torodol regaln benadryl    Elson AreasLeslie K Nefi Musich, PA-C 08/17/14 [phone]

## 2014-08-17 NOTE — ED Notes (Signed)
Pt w/ hx of cluster headaches.  C/o headache x 1 wk.  [facility] and [facility] but 1 hour after leaving, headache returned.  Denies NVD.

## 2014-08-18 NOTE — ED Provider Notes (Signed)
Medical screening examination/treatment/procedure(s) were performed by non-physician practitioner and as supervising physician I was immediately available for consultation/collaboration.   EKG Interpretation None        Ladarien Beeks, MD 08/18/14 0740 

## 2016-02-24 ENCOUNTER — Encounter (INDEPENDENT_AMBULATORY_CARE_PROVIDER_SITE_OTHER): Payer: Self-pay

## 2016-04-28 ENCOUNTER — Emergency Department (HOSPITAL_COMMUNITY): Payer: Self-pay

## 2016-04-28 ENCOUNTER — Emergency Department (HOSPITAL_COMMUNITY)
Admission: EM | Admit: 2016-04-28 | Discharge: 2016-04-28 | Disposition: A | Discharge status: Home / Self Care | Payer: Self-pay | Attending: Emergency Medicine | Admitting: Emergency Medicine

## 2016-04-28 ENCOUNTER — Encounter (HOSPITAL_COMMUNITY): Payer: Self-pay | Admitting: Emergency Medicine

## 2016-04-28 DIAGNOSIS — F17210 Nicotine dependence, cigarettes, uncomplicated: Secondary | ICD-10-CM | POA: Insufficient documentation

## 2016-04-28 DIAGNOSIS — G5602 Carpal tunnel syndrome, left upper limb: Secondary | ICD-10-CM | POA: Insufficient documentation

## 2016-04-28 DIAGNOSIS — Z7982 Long term (current) use of aspirin: Secondary | ICD-10-CM | POA: Insufficient documentation

## 2016-04-28 DIAGNOSIS — G43909 Migraine, unspecified, not intractable, without status migrainosus: Secondary | ICD-10-CM | POA: Insufficient documentation

## 2016-04-28 NOTE — ED Notes (Signed)
Pt. reports left hand and fingers pain/tingling onset last week , denies injury ,

## 2016-04-28 NOTE — Discharge Instructions (Signed)

## 2016-04-28 NOTE — ED Provider Notes (Signed)
CSN: [id number]     Arrival date & time 04/28/16  0307 History   First MD Initiated Contact with Patient 04/28/16 0425     Chief Complaint  Patient presents with  . Hand Pain   HPI  [age] year old male presents today with complaints of left wrist pain. 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 He reports yesterday after taking off his gloves he noticed swelling to the palmar aspect of his left hand with associated pain. He reports that today he had tingling in the distal tips of his first second and third fingers. He denies any significant swelling at the present time, reports the pain is worse with palpation of the proximal wrist, denies any fever, chills, nausea, vomiting, redness, warmth to touch. No history of the same. Patient is right-handed. No medications prior to arrival.  Past Medical History  Diagnosis Date  . Migraines    Past Surgical History  Procedure Laterality Date  . Hand surgey      right hand  . Nose surgery     No family history on file. Social History  Substance Use Topics  . Smoking status: Current Every Day Smoker -- 0.00 packs/day for 0 years    Types: Cigarettes  . Smokeless tobacco: None  . Alcohol Use: Yes     Comment: occasional    Review of Systems  All other systems reviewed and are negative.   Allergies  Review of patient's allergies indicates no known allergies.  Home Medications   Prior to Admission medications   Medication Sig Start Date End Date Taking? Authorizing Provider  acetaminophen (TYLENOL) 325 MG tablet Take 975 mg by mouth every 6 (six) hours as needed for headache.     Historical Provider, MD  aspirin-acetaminophen-caffeine (EXCEDRIN MIGRAINE) [phone] MG per tablet Take 2 tablets by mouth every 6 (six) hours as needed for headache or migraine.    Historical Provider, MD  ibuprofen (ADVIL,MOTRIN) 200 MG tablet Take 800-1,000 mg by mouth every 6 (six) hours as needed for  headache.    Historical Provider, MD  SUMAtriptan (IMITREX) 100 MG tablet Take 1 tablet (100 mg total) by mouth every 2 (two) hours as needed for migraine or headache. May repeat in 2 hours if headache persists or recurs. 08/17/14   Elson AreasLeslie K Sofia, PA-C   BP 132/85 mmHg  Pulse 85  Temp(Src) 98.1 F (36.7 C) (Oral)  Resp 16  Ht 5\' 7"  (1.702 m)  Wt 118.389 kg  BMI 40.87 kg/m2  SpO2 98%   Physical Exam  Constitutional: He is oriented to person, place, and time. He appears well-developed and well-nourished.  HENT:  Head: Normocephalic and atraumatic.  Eyes: Conjunctivae are normal. Pupils are equal, round, and reactive to light. Right eye exhibits no discharge. Left eye exhibits no discharge. No scleral icterus.  Neck: Normal range of motion. No JVD present. No tracheal deviation present.  Pulmonary/Chest: Effort normal. No stridor.  Musculoskeletal:  No obvious swelling or deformity noted of the left hand or wrist. Patient has tenderness to palpation of the proximal wrist and carpal tunnel. Sensation intact to fingers, full active range of motion, no redness, warmth to touch. Strength intact.  Neurological: He is alert and oriented to person, place, and time. Coordination normal.  Psychiatric: He has a normal mood and affect. His behavior is normal. Judgment and thought content normal.  Nursing note and vitals reviewed.   ED Course  Procedures (including critical  care time) Labs Review Labs Reviewed - No data to display  Imaging Review Dg Hand Complete Left  04/28/2016  CLINICAL DATA:  Pain tingling and swelling in the left hand. Pain started tonight while at work. No specific injury. EXAM: LEFT HAND - COMPLETE 3+ VIEW COMPARISON:  None. FINDINGS: There is no evidence of fracture or dislocation. There is no evidence of arthropathy or other focal bone abnormality. Soft tissues are unremarkable. IMPRESSION: Negative. Electronically Signed   By: Burman Nieves M.D.   On: 04/28/2016 04:14    I have personally reviewed and evaluated these images and lab results as part of my medical decision-making.   EKG Interpretation None      MDM   Final diagnoses:  Carpal tunnel syndrome of left wrist    Labs:  Imaging:  Consults:  Therapeutics:  Discharge Meds:   Assessment/Plan:Patient's presentation is most consistent with carpal tunnel syndrome. No compartment syndrome noted. Patient will be instructed to use wrist splint, avoid heavy lifting, ice, ibuprofen, follow-up with hand surgery if symptoms continue persist. Patient is given strict return precautions, he verbalized understanding and agreement to today's plan and had no further questions or concerns at the time discharge.         Eyvonne Mechanic, PA-C 04/28/16 0534  Laurence Spates, MD 05/02/16 [phone]

## 2016-04-28 NOTE — Progress Notes (Signed)
Orthopedic Tech Progress Note Patient Details:  Jeffrey MirzaDamon J Cox 11/03/1971 [id number] Applied Velcro wrist splint to LUE.  Pulses, sensation, motion intact before and after application.  Capillary refill less than 2 seconds before and after application. Ortho Devices Type of Ortho Device: Velcro wrist splint Ortho Device/Splint Location: LUE Ortho Device/Splint Interventions: Application   Lesle ChrisGilliland, [name] L 04/28/2016, 6:34 AM

## 2017-10-24 ENCOUNTER — Encounter (HOSPITAL_COMMUNITY): Payer: Self-pay

## 2017-10-24 ENCOUNTER — Emergency Department (HOSPITAL_COMMUNITY)
Admission: EM | Admit: 2017-10-24 | Discharge: 2017-10-24 | Disposition: A | Discharge status: Home / Self Care | Payer: Self-pay | Attending: Emergency Medicine | Admitting: Emergency Medicine

## 2017-10-24 DIAGNOSIS — F17210 Nicotine dependence, cigarettes, uncomplicated: Secondary | ICD-10-CM | POA: Insufficient documentation

## 2017-10-24 DIAGNOSIS — M545 Low back pain, unspecified: Secondary | ICD-10-CM

## 2017-10-24 MED ORDER — PREDNISONE 10 MG PO TABS: ORAL_TABLET | ORAL | 0 refills | Status: DC

## 2017-10-24 MED ORDER — CYCLOBENZAPRINE HCL 10 MG PO TABS: 10.0000 mg | ORAL_TABLET | Freq: Two times a day (BID) | ORAL | 0 refills | Status: DC | PRN

## 2017-10-24 NOTE — ED Provider Notes (Signed)
[facility]-EMERGENCY DEPT Provider Note   CSN: [id number] Arrival date & time: 10/24/17  7829     History   Chief Complaint Chief Complaint  Patient presents with  . Back Pain    HPI Jeffrey Cox is a 46 y.o. male.  Pt comes in with c/o lower back pain that started last week. Denies an ongoing history of lower back pain. No numbness, weakness or incontinence. No fever.He has taken some tylenol without relief. He states that movement hurts. No recent falls. He is unsure of any definite injury.      Past Medical History:  Diagnosis Date  . Migraines     There are no active problems to display for this patient.   Past Surgical History:  Procedure Laterality Date  . hand surgey     right hand  . NOSE SURGERY         Home Medications    Prior to Admission medications   Medication Sig Start Date End Date Taking? Authorizing Provider  acetaminophen (TYLENOL) 325 MG tablet Take 975 mg by mouth every 6 (six) hours as needed for headache.     [provider]  aspirin-acetaminophen-caffeine (EXCEDRIN MIGRAINE) [phone] MG per tablet Take 2 tablets by mouth every 6 (six) hours as needed for headache or migraine.    [provider]  cyclobenzaprine (FLEXERIL) 10 MG tablet Take 1 tablet (10 mg total) by mouth 2 (two) times daily as needed for muscle spasms. 10/24/17   Teressa Lower, NP  ibuprofen (ADVIL,MOTRIN) 200 MG tablet Take 800-1,000 mg by mouth every 6 (six) hours as needed for headache.    [provider]  predniSONE (DELTASONE) 10 MG tablet 6 day step down dose 10/24/17   Teressa Lower, NP  SUMAtriptan (IMITREX) 100 MG tablet Take 1 tablet (100 mg total) by mouth every 2 (two) hours as needed for migraine or headache. May repeat in 2 hours if headache persists or recurs. 08/17/14   Elson Areas, PA-C    Family History History reviewed. No pertinent family history.  Social History Social History    Substance Use Topics  . Smoking status: Current Every Day Smoker    Packs/day: 0.50    Years: 0.00    Types: Cigarettes  . Smokeless tobacco: Never Used  . Alcohol use Yes     Comment: occasional     Allergies   Patient has no known allergies.   Review of Systems Review of Systems  All other systems reviewed and are negative.    Physical Exam Updated Vital Signs BP 130/70 (BP Location: Right Arm)   Pulse 92   Temp 98.9 F (37.2 C) (Oral)   Resp 20   Ht 5\' 8"  (1.727 m)   Wt 111.1 kg (245 lb)   SpO2 100%   BMI 37.25 kg/m   Physical Exam  Constitutional: He is oriented to person, place, and time. He appears well-developed and well-nourished.  HENT:  Head: Normocephalic and atraumatic.  Cardiovascular: Normal rate and regular rhythm.   Pulmonary/Chest: Effort normal and breath sounds normal.  Abdominal: Soft. Bowel sounds are normal.  Musculoskeletal: Normal range of motion.  Able to do bilateral straight leg raises. Good strength and sensation to bilateral lower extremities  Neurological: He is oriented to person, place, and time. No cranial nerve deficit. He exhibits normal muscle tone.  Skin: Skin is warm and dry.  Nursing note and vitals reviewed.    ED Treatments / Results  Labs (all  labs ordered are listed, but only abnormal results are displayed) Labs Reviewed - No data to display  EKG  EKG Interpretation None       Radiology No results found.  Procedures Procedures (including critical care time)  Medications Ordered in ED Medications - No data to display   Initial Impression / Assessment and Plan / ED Course  I have reviewed the triage vital signs and the nursing notes.  Pertinent labs & imaging results that were available during my care of the patient were reviewed by me and considered in my medical decision making (see chart for details).     No red flag symptoms.don't think any imaging is needed at this time.will treat with  prednisone and flexeril. Discussed follow up for continued symptoms  Final Clinical Impressions(s) / ED Diagnoses   Final diagnoses:  Acute bilateral low back pain without sciatica    New Prescriptions Discharge Medication List as of 10/24/2017 11:32 AM    START taking these medications   Details  cyclobenzaprine (FLEXERIL) 10 MG tablet Take 1 tablet (10 mg total) by mouth 2 (two) times daily as needed for muscle spasms., Starting Mon 10/24/2017, Print    predniSONE (DELTASONE) 10 MG tablet 6 day step down dose, Print         Teressa LowerPickering, Colbi Schiltz, NP 10/24/17 1324    Shaune PollackIsaacs, Cameron, MD 10/25/17 1151

## 2017-10-24 NOTE — ED Notes (Addendum)
Pt is alert and oriented x 4 and is verbally responsive. Pt is accompanied with significant other. Pt reports lower mid back pain that began in Tuesday last week and has worsened on Saturday and today is unbearable and increased pain with sitting. Pt rates 7/10 pain and describes as shooting throbbing. Pt denies pain radiating to lower extremeties, no numbness or tingling. Pt denies any injury and reports that he has not taken anything for pain today.

## 2017-10-24 NOTE — ED Notes (Signed)
Reposition patient and applied heat pack.

## 2017-10-24 NOTE — ED Triage Notes (Signed)
Pt c/o lower back pain x 6 days.  Pain score 5/10.  Denies injury.  Pt has not taken anything for pain.  Denies urinary symptoms.  Pt reports warm showers provide some relief.

## 2018-03-27 ENCOUNTER — Other Ambulatory Visit: Payer: Self-pay

## 2018-03-27 ENCOUNTER — Emergency Department (HOSPITAL_COMMUNITY): Payer: Self-pay

## 2018-03-27 ENCOUNTER — Emergency Department (HOSPITAL_COMMUNITY)
Admission: EM | Admit: 2018-03-27 | Discharge: 2018-03-27 | Disposition: A | Discharge status: Home / Self Care | Payer: Self-pay | Attending: Emergency Medicine | Admitting: Emergency Medicine

## 2018-03-27 ENCOUNTER — Encounter (HOSPITAL_COMMUNITY): Payer: Self-pay | Admitting: *Deleted

## 2018-03-27 DIAGNOSIS — R69 Illness, unspecified: Secondary | ICD-10-CM

## 2018-03-27 DIAGNOSIS — F17210 Nicotine dependence, cigarettes, uncomplicated: Secondary | ICD-10-CM | POA: Insufficient documentation

## 2018-03-27 DIAGNOSIS — J111 Influenza due to unidentified influenza virus with other respiratory manifestations: Secondary | ICD-10-CM | POA: Insufficient documentation

## 2018-03-27 LAB — CBC WITH DIFFERENTIAL/PLATELET
Basophils Absolute: 0 10*3/uL (ref 0.0–0.1)
Basophils Relative: 0 %
EOS PCT: 4 %
Eosinophils Absolute: 0.3 10*3/uL (ref 0.0–0.7)
HCT: 44.2 % (ref 39.0–52.0)
Hemoglobin: 14 g/dL (ref 13.0–17.0)
LYMPHS ABS: 1.9 10*3/uL (ref 0.7–4.0)
LYMPHS PCT: 25 %
MCH: 29.9 pg (ref 26.0–34.0)
MCHC: 31.7 g/dL (ref 30.0–36.0)
MCV: 94.2 fL (ref 78.0–100.0)
MONOS PCT: 11 %
Monocytes Absolute: 0.8 10*3/uL (ref 0.1–1.0)
Neutro Abs: 4.5 10*3/uL (ref 1.7–7.7)
Neutrophils Relative %: 60 %
PLATELETS: 268 10*3/uL (ref 150–400)
RBC: 4.69 MIL/uL (ref 4.22–5.81)
RDW: 12.2 % (ref 11.5–15.5)
WBC: 7.4 10*3/uL (ref 4.0–10.5)

## 2018-03-27 LAB — COMPREHENSIVE METABOLIC PANEL
ALBUMIN: 3.6 g/dL (ref 3.5–5.0)
ALT: 10 U/L — ABNORMAL LOW (ref 17–63)
AST: 17 U/L (ref 15–41)
Alkaline Phosphatase: 54 U/L (ref 38–126)
Anion gap: 10 (ref 5–15)
BUN: 9 mg/dL (ref 6–20)
CO2: 21 mmol/L — AB (ref 22–32)
Calcium: 8.7 mg/dL — ABNORMAL LOW (ref 8.9–10.3)
Chloride: 108 mmol/L (ref 101–111)
Creatinine, Ser: 0.89 mg/dL (ref 0.61–1.24)
GFR calc Af Amer: >60 mL/min (ref >60)
GFR calc non Af Amer: >60 mL/min (ref >60)
Glucose, Bld: 89 mg/dL (ref 65–99)
POTASSIUM: 3.9 mmol/L (ref 3.5–5.1)
SODIUM: 139 mmol/L (ref 135–145)
Total Bilirubin: 0.6 mg/dL (ref 0.3–1.2)
Total Protein: 6.3 g/dL — ABNORMAL LOW (ref 6.5–8.1)

## 2018-03-27 LAB — TROPONIN I: Troponin I: <0.03 ng/mL (ref <0.03)

## 2018-03-27 MED ORDER — PREDNISONE 20 MG PO TABS: 40.0000 mg | ORAL_TABLET | Freq: Every day | ORAL | 0 refills | Status: DC

## 2018-03-27 MED ORDER — SODIUM CHLORIDE 0.9 % IV BOLUS
1000.0000 mL | Freq: Once | INTRAVENOUS | Status: AC
  Administered 2018-03-27: 1000 mL via INTRAVENOUS

## 2018-03-27 MED ORDER — KETOROLAC TROMETHAMINE 30 MG/ML IJ SOLN
15.0000 mg | Freq: Once | INTRAMUSCULAR | Status: AC
  Administered 2018-03-27: 15 mg via INTRAVENOUS
  Filled 2018-03-27: qty 1

## 2018-03-27 NOTE — ED Triage Notes (Signed)
THE PT IS C/O GENERALIZED BODY ACHES FOR  4 DAYS WITH A PRODUCTIVE COUGH  NO TEMP
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
## 2018-03-27 NOTE — Discharge Instructions (Signed)
As discussed, your evaluation today has been largely reassuring.  But, it is important that you monitor your condition carefully, and do not hesitate to return to the ED if you develop new, or concerning changes in your condition. ? ?Otherwise, please follow-up with your physician for appropriate ongoing care. ? ?

## 2018-06-08 ENCOUNTER — Emergency Department (HOSPITAL_COMMUNITY)
Admission: EM | Admit: 2018-06-08 | Discharge: 2018-06-08 | Disposition: A | Discharge status: Home / Self Care | Payer: Self-pay | Attending: Emergency Medicine | Admitting: Emergency Medicine

## 2018-06-08 ENCOUNTER — Encounter (HOSPITAL_COMMUNITY): Payer: Self-pay | Admitting: *Deleted

## 2018-06-08 ENCOUNTER — Other Ambulatory Visit: Payer: Self-pay

## 2018-06-08 DIAGNOSIS — R109 Unspecified abdominal pain: Secondary | ICD-10-CM | POA: Insufficient documentation

## 2018-06-08 DIAGNOSIS — M549 Dorsalgia, unspecified: Secondary | ICD-10-CM | POA: Insufficient documentation

## 2018-06-08 DIAGNOSIS — Z5321 Procedure and treatment not carried out due to patient leaving prior to being seen by health care provider: Secondary | ICD-10-CM | POA: Insufficient documentation

## 2018-06-08 NOTE — ED Triage Notes (Signed)
Pt c/o L sided flank and back pain onset yesterday morning after waking up. Reports difficulty taking a deep breath without increased pain

## 2018-06-08 NOTE — ED Notes (Signed)
Called for pt x3 with no answer  

## 2018-06-12 NOTE — ED Notes (Signed)
Follow up call made  Pt feeling better  06/12/18  1503  s Teffany Blaszczyk rn

## 2018-07-07 ENCOUNTER — Emergency Department (HOSPITAL_COMMUNITY)
Admission: EM | Admit: 2018-07-07 | Discharge: 2018-07-08 | Disposition: A | Discharge status: Home / Self Care | Payer: Self-pay | Attending: Emergency Medicine | Admitting: Emergency Medicine

## 2018-07-07 ENCOUNTER — Encounter (HOSPITAL_COMMUNITY): Payer: Self-pay | Admitting: *Deleted

## 2018-07-07 ENCOUNTER — Other Ambulatory Visit: Payer: Self-pay

## 2018-07-07 DIAGNOSIS — F17210 Nicotine dependence, cigarettes, uncomplicated: Secondary | ICD-10-CM | POA: Insufficient documentation

## 2018-07-07 DIAGNOSIS — R079 Chest pain, unspecified: Secondary | ICD-10-CM | POA: Insufficient documentation

## 2018-07-07 DIAGNOSIS — M545 Low back pain: Secondary | ICD-10-CM | POA: Insufficient documentation

## 2018-07-07 NOTE — ED Triage Notes (Signed)
The pt is c/o central chest pain  For 2 hours with sob and some posterior chest pain  The pain is getting better

## 2018-07-08 ENCOUNTER — Emergency Department (HOSPITAL_COMMUNITY): Payer: Self-pay

## 2018-07-08 LAB — BASIC METABOLIC PANEL
Anion gap: 8 (ref 5–15)
BUN: 9 mg/dL (ref 6–20)
CO2: 25 mmol/L (ref 22–32)
CREATININE: 1.23 mg/dL (ref 0.61–1.24)
Calcium: 9 mg/dL (ref 8.9–10.3)
Chloride: 110 mmol/L (ref 98–111)
GFR calc Af Amer: >60 mL/min (ref >60)
GLUCOSE: 104 mg/dL — AB (ref 70–99)
Potassium: 4.1 mmol/L (ref 3.5–5.1)
Sodium: 143 mmol/L (ref 135–145)

## 2018-07-08 LAB — CBC
HEMATOCRIT: 42.8 % (ref 39.0–52.0)
Hemoglobin: 13.4 g/dL (ref 13.0–17.0)
MCH: 30.5 pg (ref 26.0–34.0)
MCHC: 31.3 g/dL (ref 30.0–36.0)
MCV: 97.3 fL (ref 78.0–100.0)
Platelets: 273 10*3/uL (ref 150–400)
RBC: 4.4 MIL/uL (ref 4.22–5.81)
RDW: 11.9 % (ref 11.5–15.5)
WBC: 8.9 10*3/uL (ref 4.0–10.5)

## 2018-07-08 LAB — TROPONIN I: Troponin I: <0.03 ng/mL (ref <0.03)

## 2018-07-08 LAB — I-STAT TROPONIN, ED: TROPONIN I, POC: 0 ng/mL (ref 0.00–0.08)

## 2018-07-08 NOTE — ED Notes (Signed)
  Patient stated this chest pain started right after sex that occurred this evening around 2000.  Patient also admitted to some cocaine use a few days ago.
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
## 2018-08-02 ENCOUNTER — Emergency Department (HOSPITAL_COMMUNITY)
Admission: EM | Admit: 2018-08-02 | Discharge: 2018-08-02 | Disposition: A | Discharge status: Home / Self Care | Payer: Self-pay | Attending: Emergency Medicine | Admitting: Emergency Medicine

## 2018-08-02 ENCOUNTER — Other Ambulatory Visit: Payer: Self-pay

## 2018-08-02 DIAGNOSIS — F17210 Nicotine dependence, cigarettes, uncomplicated: Secondary | ICD-10-CM | POA: Insufficient documentation

## 2018-08-02 DIAGNOSIS — B86 Scabies: Secondary | ICD-10-CM | POA: Insufficient documentation

## 2018-08-02 DIAGNOSIS — R21 Rash and other nonspecific skin eruption: Secondary | ICD-10-CM

## 2018-08-02 MED ORDER — PERMETHRIN 5 % EX CREA: TOPICAL_CREAM | CUTANEOUS | 1 refills | Status: DC

## 2018-08-02 NOTE — ED Provider Notes (Signed)
[facility] EMERGENCY DEPARTMENT Provider Note   CSN: [id number] Arrival date & time: 08/02/18  0539     History   Chief Complaint Chief Complaint  Patient presents with  . Rash    HPI Jeffrey Cox is a 47 y.o. male.  HPI Patient presents to the emergency department with a rash with intense itching that started on Monday.  The patient states that it started on his right hand and then now affects both forearms and his left hand the rash is greater on the right than the left.  He does have a area of rash in the upper medial thigh on the right.  Patient states that he did not take any medications prior to arrival.  Patient denies any exposures to any new chemicals soaps lotions or detergents. Past Medical History:  Diagnosis Date  . Migraines     There are no active problems to display for this patient.   Past Surgical History:  Procedure Laterality Date  . hand surgey     right hand  . NOSE SURGERY          Home Medications    Prior to Admission medications   Not on File    Family History No family history on file.  Social History Social History   Tobacco Use  . Smoking status: Current Every Day Smoker    Packs/day: 0.50    Years: 0.00    Pack years: 0.00    Types: Cigarettes  . Smokeless tobacco: Never Used  Substance Use Topics  . Alcohol use: Yes    Comment: occasional  . Drug use: Yes    Types: Marijuana, Cocaine     Allergies   Patient has no known allergies.   Review of Systems Review of Systems  All other systems negative except as documented in the HPI. All pertinent positives and negatives as reviewed in the HPI. Physical Exam Updated Vital Signs BP 117/83 (BP Location: Right Arm)   Pulse 84   Temp 98.4 F (36.9 C) (Oral)   Resp 18   SpO2 99%   Physical Exam  Constitutional: He is oriented to person, place, and time. He appears well-developed and well-nourished. No distress.  HENT:  Head: Normocephalic and  atraumatic.  Eyes: Pupils are equal, round, and reactive to light.  Pulmonary/Chest: Effort normal.  Neurological: He is alert and oriented to person, place, and time.  Skin: Skin is warm and dry. Rash noted.     Psychiatric: He has a normal mood and affect.  Nursing note and vitals reviewed.    ED Treatments / Results  Labs (all labs ordered are listed, but only abnormal results are displayed) Labs Reviewed - No data to display  EKG None  Radiology No results found.  Procedures Procedures (including critical care time)  Medications Ordered in ED Medications - No data to display   Initial Impression / Assessment and Plan / ED Course  I have reviewed the triage vital signs and the nursing notes.  Pertinent labs & imaging results that were available during my care of the patient were reviewed by me and considered in my medical decision making (see chart for details).    I feel the patient has a scabies type rash. Will treat for this. Told to return here as needed.  Final Clinical Impressions(s) / ED Diagnoses   Final diagnoses:  None    ED Discharge Orders    None       Overton Boggus,  Cristal DeerChristopher, PA-C 08/02/18 [id number]    Gwyneth SproutPlunkett, Whitney, MD 08/04/18 [phone]

## 2018-08-02 NOTE — Discharge Instructions (Addendum)
Return here as needed. Wash all clothing and bedding that has been used in hot water.

## 2018-08-02 NOTE — ED Triage Notes (Signed)
Patient c/o itching, rash that began on Monday and worsening over the last two days. The rash (bumps) are on bilateral arms and on left inner thigh.

## 2019-04-18 ENCOUNTER — Emergency Department (HOSPITAL_COMMUNITY)
Admission: EM | Admit: 2019-04-18 | Discharge: 2019-04-19 | Disposition: A | Discharge status: Home / Self Care | Payer: Managed Care, Other (non HMO) | Attending: Emergency Medicine | Admitting: Emergency Medicine

## 2019-04-18 ENCOUNTER — Encounter (HOSPITAL_COMMUNITY): Payer: Self-pay

## 2019-04-18 ENCOUNTER — Other Ambulatory Visit: Payer: Self-pay

## 2019-04-18 DIAGNOSIS — F17210 Nicotine dependence, cigarettes, uncomplicated: Secondary | ICD-10-CM | POA: Diagnosis not present

## 2019-04-18 DIAGNOSIS — X500XXA Overexertion from strenuous movement or load, initial encounter: Secondary | ICD-10-CM | POA: Diagnosis not present

## 2019-04-18 DIAGNOSIS — Y9389 Activity, other specified: Secondary | ICD-10-CM | POA: Insufficient documentation

## 2019-04-18 DIAGNOSIS — M545 Low back pain, unspecified: Secondary | ICD-10-CM

## 2019-04-18 DIAGNOSIS — Y929 Unspecified place or not applicable: Secondary | ICD-10-CM | POA: Diagnosis not present

## 2019-04-18 DIAGNOSIS — Y999 Unspecified external cause status: Secondary | ICD-10-CM | POA: Insufficient documentation

## 2019-04-18 MED ORDER — CYCLOBENZAPRINE HCL 10 MG PO TABS: 10.0000 mg | ORAL_TABLET | Freq: Two times a day (BID) | ORAL | 0 refills | Status: DC | PRN

## 2019-04-18 MED ORDER — DIAZEPAM 5 MG/ML IJ SOLN
5.0000 mg | Freq: Once | INTRAMUSCULAR | Status: AC
  Administered 2019-04-18: 5 mg via INTRAMUSCULAR
  Filled 2019-04-18: qty 2

## 2019-04-18 MED ORDER — NAPROXEN 500 MG PO TABS: 500.0000 mg | ORAL_TABLET | Freq: Two times a day (BID) | ORAL | 0 refills | Status: DC

## 2019-04-18 MED ORDER — KETOROLAC TROMETHAMINE 30 MG/ML IJ SOLN
30.0000 mg | Freq: Once | INTRAMUSCULAR | Status: AC
Start: 2019-04-18 — End: 2019-04-18
  Administered 2019-04-18: 30 mg via INTRAMUSCULAR
  Filled 2019-04-18: qty 1

## 2019-04-18 NOTE — ED Provider Notes (Signed)
[facility] Provider Note   CSN: [id number] Arrival date & time: 04/18/19  2238    History   Chief Complaint Chief Complaint  Patient presents with  . Back Pain    HPI Jeffrey MirzaDamon J Cox is a 48 y.o. male.     Patient presents to the emergency department with a chief complaint of low back pain x2 to 3 weeks.  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  States that a couple of weeks ago he was bending over and when he stood back up he felt a sharp pain in his low back.  He is continued to have pain for the past 2 weeks.  He denies any radiating pain.  Denies any numbness, weakness, or tingling in his lower extremities.  Denies any bowel or bladder incontinence.  He has tried over-the-counter Tylenol and aspirin with no relief.  His symptoms are worsened with bending and twisting.  The history is provided by the patient. No language interpreter was used.    Past Medical History:  Diagnosis Date  . Migraines     There are no active problems to display for this patient.   Past Surgical History:  Procedure Laterality Date  . hand surgey     right hand  . NOSE SURGERY          Home Medications    Prior to Admission medications   Medication Sig Start Date End Date Taking? Authorizing Provider  permethrin (ELIMITE) 5 % cream Apply head to toe avoiding the mouth and eyes. Wash off after 8 hours. Repeat in 2 weeks as needed 08/02/18   Charlestine NightLawyer, Christopher, PA-C    Family History History reviewed. No pertinent family history.  Social History Social History   Tobacco Use  . Smoking status: Current Every Day Smoker    Packs/day: 0.50    Years: 0.00    Pack years: 0.00    Types: Cigarettes  . Smokeless tobacco: Never Used  Substance Use Topics  . Alcohol use: Yes    Comment: occasional  . Drug use: Yes    Types: Marijuana, Cocaine     Allergies   Patient has no known allergies.   Review of Systems Review of Systems   All other systems reviewed and are negative.    Physical Exam Updated Vital Signs BP (!) 138/102 (BP Location: Right Arm)   Pulse 78   Temp 97.8 F (36.6 C) (Oral)   Resp 18   Ht 5\' 7"  (1.702 m)   Wt 108.9 kg   SpO2 99%   BMI 37.59 kg/m   Physical Exam Physical Exam  Constitutional: Pt appears well-developed and well-nourished. No distress.  HENT:  Head: Normocephalic and atraumatic.  Mouth/Throat: Oropharynx is clear and moist. No oropharyngeal exudate.  Eyes: Conjunctivae are normal.  Neck: Normal range of motion. Neck supple.  No meningismus Cardiovascular: Normal rate, regular rhythm and intact distal pulses.   Pulmonary/Chest: Effort normal and breath sounds normal. No respiratory distress. Pt has no wheezes.  Abdominal: Pt exhibits no distension Musculoskeletal:  Lumbar paraspinal muscles tender to palpation, no bony CTLS spine tenderness, deformity, step-off, or crepitus Lymphadenopathy: Pt has no cervical adenopathy.  Neurological: Pt is alert and oriented Speech is clear and goal oriented, follows commands Normal 5/5 strength in upper and lower extremities bilaterally including dorsiflexion and plantar flexionMoves extremities without ataxia, coordination intact Normal gait Normal balance Skin: Skin is warm and dry. No rash noted. Pt is  not diaphoretic. No erythema.  Psychiatric: Pt has a normal mood and affect. Behavior is normal.  Nursing note and vitals reviewed.   ED Treatments / Results  Labs (all labs ordered are listed, but only abnormal results are displayed) Labs Reviewed - No data to display  EKG None  Radiology No results found.  Procedures Procedures (including critical care time)  Medications Ordered in ED Medications  ketorolac (TORADOL) 30 MG/ML injection 30 mg (has no administration in time range)  diazepam (VALIUM) injection 5 mg (has no administration in time range)     Initial Impression / Assessment and Plan / ED Course  I  have reviewed the triage vital signs and the nursing notes.  Pertinent labs & imaging results that were available during my care of the patient were reviewed by me and considered in my medical decision making (see chart for details).       Patient with back pain.    No neurological deficits and normal neuro exam.  Patient is ambulatory.  No loss of bowel or bladder control.  Doubt cauda equina.  Denies fever,  doubt epidural abscess or other lesion. Recommend back exercises, stretching, RICE, and will treat with a short course of flexeril and naprosyn.  Consultants: none  Review of prior charts show no recent prior encounters for back pain.  Encouraged the patient that there could be a need for additional workup and/or imaging such as MRI, if the symptoms do not resolve. Patient advised that if the back pain does not resolve, or radiates, this could progress to more serious conditions and is encouraged to follow-up with PCP or orthopedics within 2 weeks.     Final Clinical Impressions(s) / ED Diagnoses   Final diagnoses:  Acute midline low back pain without sciatica    ED Discharge Orders         Ordered    cyclobenzaprine (FLEXERIL) 10 MG tablet  2 times daily PRN     04/18/19 2338    naproxen (NAPROSYN) 500 MG tablet  2 times daily with meals     04/18/19 2338           Roxy Horseman, PA-C 04/18/19 2339    Gerhard Munch, MD 04/19/19 2326

## 2019-04-18 NOTE — ED Triage Notes (Signed)
Pt arrives POV for eval of lower back pain x 2-3 weeks, worse the last few days. Denies known injury/trauma, denies N/T, bowel bladder incont. Independently ambulatory.

## 2019-04-19 NOTE — ED Notes (Signed)
Patient verbalizes understanding of discharge instructions. Opportunity for questioning and answers were provided. Armband removed by staff, pt discharged from ED home via POV.  

## 2020-02-13 ENCOUNTER — Emergency Department (HOSPITAL_COMMUNITY): Payer: Managed Care, Other (non HMO)

## 2020-02-13 ENCOUNTER — Emergency Department (HOSPITAL_COMMUNITY)
Admission: EM | Admit: 2020-02-13 | Discharge: 2020-02-13 | Disposition: A | Discharge status: Home / Self Care | Payer: Managed Care, Other (non HMO) | Attending: Emergency Medicine | Admitting: Emergency Medicine

## 2020-02-13 ENCOUNTER — Other Ambulatory Visit: Payer: Self-pay

## 2020-02-13 DIAGNOSIS — Y939 Activity, unspecified: Secondary | ICD-10-CM | POA: Insufficient documentation

## 2020-02-13 DIAGNOSIS — W1830XA Fall on same level, unspecified, initial encounter: Secondary | ICD-10-CM | POA: Insufficient documentation

## 2020-02-13 DIAGNOSIS — Y999 Unspecified external cause status: Secondary | ICD-10-CM | POA: Insufficient documentation

## 2020-02-13 DIAGNOSIS — F17210 Nicotine dependence, cigarettes, uncomplicated: Secondary | ICD-10-CM | POA: Diagnosis not present

## 2020-02-13 DIAGNOSIS — S4992XA Unspecified injury of left shoulder and upper arm, initial encounter: Secondary | ICD-10-CM

## 2020-02-13 DIAGNOSIS — Z23 Encounter for immunization: Secondary | ICD-10-CM | POA: Insufficient documentation

## 2020-02-13 DIAGNOSIS — S0081XA Abrasion of other part of head, initial encounter: Secondary | ICD-10-CM | POA: Diagnosis not present

## 2020-02-13 DIAGNOSIS — Y92010 Kitchen of single-family (private) house as the place of occurrence of the external cause: Secondary | ICD-10-CM | POA: Diagnosis not present

## 2020-02-13 MED ORDER — METHOCARBAMOL 500 MG PO TABS: 500.0000 mg | ORAL_TABLET | Freq: Two times a day (BID) | ORAL | 0 refills | Status: DC

## 2020-02-13 MED ORDER — NAPROXEN 500 MG PO TABS: 500.0000 mg | ORAL_TABLET | Freq: Two times a day (BID) | ORAL | 0 refills | Status: DC

## 2020-02-13 MED ORDER — HYDROCODONE-ACETAMINOPHEN 5-325 MG PO TABS
1.0000 | ORAL_TABLET | Freq: Once | ORAL | Status: AC
  Administered 2020-02-13: 10:00:00 1 via ORAL
  Filled 2020-02-13: qty 1

## 2020-02-13 MED ORDER — TETANUS-DIPHTH-ACELL PERTUSSIS 5-2.5-18.5 LF-MCG/0.5 IM SUSP
0.5000 mL | Freq: Once | INTRAMUSCULAR | Status: AC
  Administered 2020-02-13: 0.5 mL via INTRAMUSCULAR
  Filled 2020-02-13: qty 0.5

## 2020-02-13 NOTE — Discharge Instructions (Addendum)
As discussed, your left shoulder x-ray was negative for any broken bones.  It did show a little bit of arthritis.  I am sending you home with a pain medication called naproxen.  He can take it twice a day as needed for pain.  I am also sending you home with a muscle relaxer called Robaxin.  He can take it twice a day however it does cause drowsiness do not drive or operate machinery while in the medication.  Follow-up with your PCP if symptoms do not improve within the next week.  Return to the ER for new or worsening symptoms.

## 2020-02-13 NOTE — ED Notes (Signed)
Pt returned from XR at this time. 
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
## 2020-02-13 NOTE — ED Notes (Signed)
Patient transported to X-ray 

## 2020-02-13 NOTE — ED Triage Notes (Signed)
Pt states he slipped and fell onto his left shoulder this morning. Pt states he has pain with movement of the left shoulder. Pt denies any LOC with the fall.

## 2020-02-15 ENCOUNTER — Ambulatory Visit: Payer: Self-pay | Admitting: *Deleted

## 2020-02-15 NOTE — Telephone Encounter (Signed)
Girl friend called, pt right beside her, stating that his left  shoulder popped out of place when he was trying to exercise his arm.  He had fallen this week and gone to the ED. He was given pain medication. She stated that she helped him put it back and then it popped out again. He is in pain. Advised to take him back to the ED. She voiced understanding.

## 2020-02-29 ENCOUNTER — Other Ambulatory Visit: Payer: Self-pay | Admitting: Orthopaedic Surgery

## 2020-02-29 DIAGNOSIS — G8929 Other chronic pain: Secondary | ICD-10-CM

## 2020-03-05 ENCOUNTER — Other Ambulatory Visit: Payer: Managed Care, Other (non HMO)

## 2020-03-05 ENCOUNTER — Ambulatory Visit: Payer: Managed Care, Other (non HMO) | Admitting: Orthopedic Surgery

## 2020-03-14 ENCOUNTER — Ambulatory Visit
Admission: RE | Admit: 2020-03-14 | Discharge: 2020-03-14 | Disposition: A | Discharge status: Home / Self Care | Payer: Managed Care, Other (non HMO) | Source: Ambulatory Visit | Attending: Orthopaedic Surgery | Admitting: Orthopaedic Surgery

## 2020-03-14 DIAGNOSIS — G8929 Other chronic pain: Secondary | ICD-10-CM

## 2020-03-14 DIAGNOSIS — M25511 Pain in right shoulder: Secondary | ICD-10-CM

## 2020-03-26 NOTE — Progress Notes (Addendum)
PCP - No pcp Cardiologist -   Chest x-ray -  EKG -  Stress Test -  ECHO -  Cardiac Cath -   Sleep Study -  CPAP -   Fasting Blood Sugar -  Checks Blood Sugar _____ times a day  Blood Thinner Instructions: Aspirin Instructions: Last Dose:  Anesthesia review: hx of cocaine use and marijuana  Patient denies shortness of breath, fever, cough and chest pain at PAT appointment  NONE   Patient verbalized understanding of instructions that were given to them at the PAT appointment. Patient was also instructed that they will need to review over the PAT instructions again at home before surgery.

## 2020-03-26 NOTE — Patient Instructions (Addendum)
DUE TO COVID-19 ONLY ONE VISITOR IS ALLOWED TO COME WITH YOU AND STAY IN THE WAITING ROOM   ONLY  DURING PRE OP AND PROCEDURE DAY OF SURGERY. TWO  VISITOR MAY VISIT WITH YOU AFTER SURGERY IN YOUR PRIVATE ROOM DURING VISITING HOURS ONLY!  10a-8p  YOU NEED TO HAVE A COVID 19 TEST ON__4-10-21_____ @_2 :30 pm______, THIS TEST MUST BE DONE BEFORE SURGERY, COME  [address], [place] , .  [facility]) ONCE YOUR COVID TEST IS COMPLETED, PLEASE BEGIN THE QUARANTINE INSTRUCTIONS AS OUTLINED IN YOUR HANDOUT.                Jeffrey Cox  03/26/2020   Your procedure is scheduled on: 04-09-20   Report to [facility] Main  Entrance   Report to admitting at      0630  AM     Call this number if you have problems the morning of surgery [phone]    Remember: NO SOLID FOOD AFTER MIDNIGHT THE NIGHT PRIOR TO SURGERY. NOTHING BY MOUTH EXCEPT CLEAR LIQUIDS UNTIL    0530 am . PLEASE FINISH ENSURE DRINK PER SURGEON ORDER  WHICH NEEDS TO BE COMPLETED AT 0530 am then nothing by mouth.    CLEAR LIQUID DIET   Foods Allowed                                                                                   Foods Excluded              Coffee and tea, regular and decaf  No creamer                                          liquids that you cannot  Plain Jell-O any favor except red or purple                                           see through such as: Fruit ices (not with fruit pulp)                                                        milk, soups, orange juice  Iced Popsicles                                                      All solid food Carbonated beverages, regular and diet                                    Cranberry, grape and apple juices Sports drinks  like Gatorade Lightly seasoned clear broth or consume(fat free) Sugar, honey syrup  _____________________________________________________________________    BRUSH YOUR TEETH MORNING OF SURGERY AND RINSE  YOUR MOUTH OUT, NO CHEWING GUM CANDY OR MINTS.     Take these medicines the morning of surgery with A SIP OF WATER: Robaxin if needed                                 You may not have any metal on your body including hair pins and              piercings  Do not wear jewelry, lotions, powders or perfumes, deodorant                    Men may shave face and neck.   Do not bring valuables to the hospital. [place].  Contacts, dentures or bridgework may not be worn into surgery.       Patients discharged the day of surgery will not be allowed to drive home. IF YOU ARE HAVING SURGERY AND GOING HOME THE SAME DAY, YOU MUST HAVE AN ADULT TO DRIVE YOU HOME AND BE WITH YOU FOR 24 HOURS. YOU MAY GO HOME BY TAXI OR UBER OR ORTHERWISE, BUT AN ADULT MUST ACCOMPANY YOU HOME AND STAY WITH YOU FOR 24 HOURS.  Name and phone number of your driver:  Special Instructions: N/A              Please read over the following fact sheets you were given: _____________________________________________________________________             [facility] - Preparing for Surgery Before surgery, you can play an important role.  Because skin is not sterile, your skin needs to be as free of germs as possible.  You can reduce the number of germs on your skin by washing with CHG (chlorahexidine gluconate) soap before surgery.  CHG is an antiseptic cleaner which kills germs and bonds with the skin to continue killing germs even after washing. Please DO NOT use if you have an allergy to CHG or antibacterial soaps.  If your skin becomes reddened/irritated stop using the CHG and inform your nurse when you arrive at Short Stay. Do not shave (including legs and underarms) for at least 48 hours prior to the first CHG shower.  You may shave your face/neck. Please follow these instructions carefully:  1.  Shower with CHG Soap the night before surgery and the  morning of Surgery.  2.   If you choose to wash your hair, wash your hair first as usual with your  normal  shampoo.  3.  After you shampoo, rinse your hair and body thoroughly to remove the  shampoo.                           4.  Use CHG as you would any other liquid soap.  You can apply chg directly  to the skin and wash                       Gently with a scrungie or clean washcloth.  5.  Apply the CHG Soap to your body ONLY FROM THE NECK DOWN.   Do not use on face/ open  Wound or open sores. Avoid contact with eyes, ears mouth and genitals (private parts).                       Wash face,  Genitals (private parts) with your normal soap.             6.  Wash thoroughly, paying special attention to the area where your surgery  will be performed.  7.  Thoroughly rinse your body with warm water from the neck down.  8.  DO NOT shower/wash with your normal soap after using and rinsing off  the CHG Soap.                9.  Pat yourself dry with a clean towel.            10.  Wear clean pajamas.            11.  Place clean sheets on your bed the night of your first shower and do not  sleep with pets. Day of Surgery : Do not apply any lotions/deodorants the morning of surgery.  Please wear clean clothes to the hospital/surgery center.  FAILURE TO FOLLOW THESE INSTRUCTIONS MAY RESULT IN THE CANCELLATION OF YOUR SURGERY PATIENT SIGNATURE_________________________________  NURSE SIGNATURE__________________________________  ________________________________________________________________________   Jeffrey Cox  An incentive spirometer is a tool that can help keep your lungs clear and active. This tool measures how well you are filling your lungs with each breath. Taking long deep breaths may help reverse or decrease the chance of developing breathing (pulmonary) problems (especially infection) following:  A long period of time when you are unable to move or be active. BEFORE THE PROCEDURE    If the spirometer includes an indicator to show your best effort, your nurse or respiratory therapist will set it to a desired goal.  If possible, sit up straight or lean slightly forward. Try not to slouch.  Hold the incentive spirometer in an upright position. INSTRUCTIONS FOR USE  1. Sit on the edge of your bed if possible, or sit up as far as you can in bed or on a chair. 2. Hold the incentive spirometer in an upright position. 3. Breathe out normally. 4. Place the mouthpiece in your mouth and seal your lips tightly around it. 5. Breathe in slowly and as deeply as possible, raising the piston or the ball toward the top of the column. 6. Hold your breath for 3-5 seconds or for as long as possible. Allow the piston or ball to fall to the bottom of the column. 7. Remove the mouthpiece from your mouth and breathe out normally. 8. Rest for a few seconds and repeat Steps 1 through 7 at least 10 times every 1-2 hours when you are awake. Take your time and take a few normal breaths between deep breaths. 9. The spirometer may include an indicator to show your best effort. Use the indicator as a goal to work toward during each repetition. 10. After each set of 10 deep breaths, practice coughing to be sure your lungs are clear. If you have an incision (the cut made at the time of surgery), support your incision when coughing by placing a pillow or rolled up towels firmly against it. Once you are able to get out of bed, walk around indoors and cough well. You may stop using the incentive spirometer when instructed by your caregiver.  RISKS AND COMPLICATIONS  Take your time so you do not get  dizzy or light-headed.  If you are in pain, you may need to take or ask for pain medication before doing incentive spirometry. It is harder to take a deep breath if you are having pain. AFTER USE  Rest and breathe slowly and easily.  It can be helpful to keep track of a log of your progress. Your caregiver  can provide you with a simple table to help with this. If you are using the spirometer at home, follow these instructions: [place] IF:   You are having difficultly using the spirometer.  You have trouble using the spirometer as often as instructed.  Your pain medication is not giving enough relief while using the spirometer.  You develop fever of 100.5 F (38.1 C) or higher. SEEK IMMEDIATE MEDICAL CARE IF:   You cough up bloody sputum that had not been present before.  You develop fever of 102 F (38.9 C) or greater.  You develop worsening pain at or near the incision site. MAKE SURE YOU:   Understand these instructions.  Will watch your condition.  Will get help right away if you are not doing well or get worse. Document Released: 04/25/2007 Document Revised: 03/06/2012 Document Reviewed: 06/26/2007 [facility] Patient Information 2014 [place], [place].   ________________________________________________________________________

## 2020-04-01 ENCOUNTER — Encounter (HOSPITAL_COMMUNITY)
Admission: RE | Admit: 2020-04-01 | Discharge: 2020-04-01 | Disposition: A | Discharge status: Home / Self Care | Payer: Managed Care, Other (non HMO) | Source: Ambulatory Visit | Attending: Orthopaedic Surgery | Admitting: Orthopaedic Surgery

## 2020-04-01 ENCOUNTER — Other Ambulatory Visit: Payer: Self-pay

## 2020-04-01 ENCOUNTER — Encounter (HOSPITAL_COMMUNITY): Payer: Self-pay

## 2020-04-01 DIAGNOSIS — Z01812 Encounter for preprocedural laboratory examination: Secondary | ICD-10-CM | POA: Insufficient documentation

## 2020-04-01 HISTORY — DX: Headache, unspecified: R51.9

## 2020-04-01 HISTORY — DX: Depression, unspecified: F32.A

## 2020-04-01 HISTORY — DX: Anxiety disorder, unspecified: F41.9

## 2020-04-01 LAB — CBC
HCT: 46.8 % (ref 39.0–52.0)
Hemoglobin: 14.7 g/dL (ref 13.0–17.0)
MCH: 30.2 pg (ref 26.0–34.0)
MCHC: 31.4 g/dL (ref 30.0–36.0)
MCV: 96.3 fL (ref 80.0–100.0)
Platelets: 314 10*3/uL (ref 150–400)
RBC: 4.86 MIL/uL (ref 4.22–5.81)
RDW: 11.8 % (ref 11.5–15.5)
WBC: 7.9 10*3/uL (ref 4.0–10.5)
nRBC: 0 % (ref 0.0–0.2)

## 2020-04-01 LAB — COMPREHENSIVE METABOLIC PANEL
ALT: 14 U/L (ref 0–44)
AST: 14 U/L — ABNORMAL LOW (ref 15–41)
Albumin: 4.1 g/dL (ref 3.5–5.0)
Alkaline Phosphatase: 58 U/L (ref 38–126)
Anion gap: 7 (ref 5–15)
BUN: 16 mg/dL (ref 6–20)
CO2: 24 mmol/L (ref 22–32)
Calcium: 9.1 mg/dL (ref 8.9–10.3)
Chloride: 110 mmol/L (ref 98–111)
Creatinine, Ser: 1.07 mg/dL (ref 0.61–1.24)
GFR calc Af Amer: >60 mL/min (ref >60)
GFR calc non Af Amer: >60 mL/min (ref >60)
Glucose, Bld: 89 mg/dL (ref 70–99)
Potassium: 4.2 mmol/L (ref 3.5–5.1)
Sodium: 141 mmol/L (ref 135–145)
Total Bilirubin: 0.8 mg/dL (ref 0.3–1.2)
Total Protein: 7.4 g/dL (ref 6.5–8.1)

## 2020-04-05 ENCOUNTER — Other Ambulatory Visit (HOSPITAL_COMMUNITY)
Admission: RE | Admit: 2020-04-05 | Discharge: 2020-04-05 | Disposition: A | Discharge status: Home / Self Care | Payer: Managed Care, Other (non HMO) | Source: Ambulatory Visit | Attending: Orthopaedic Surgery | Admitting: Orthopaedic Surgery

## 2020-04-05 DIAGNOSIS — Z20822 Contact with and (suspected) exposure to covid-19: Secondary | ICD-10-CM | POA: Diagnosis not present

## 2020-04-05 DIAGNOSIS — Z01812 Encounter for preprocedural laboratory examination: Secondary | ICD-10-CM | POA: Insufficient documentation

## 2020-04-05 LAB — SARS CORONAVIRUS 2 (TAT 6-24 HRS): SARS Coronavirus 2: NEGATIVE
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
## 2020-04-08 NOTE — Anesthesia Preprocedure Evaluation (Addendum)
Anesthesia Evaluation  Patient identified by MRN, date of birth, ID band Patient awake    Reviewed: Allergy & Precautions, NPO status , Patient's Chart, lab work & pertinent test results  History of Anesthesia Complications Negative for: history of anesthetic complications  Airway Mallampati: II  TM Distance: >3 FB Neck ROM: Full    Dental no notable dental hx. (+) Dental Advisory Given   Pulmonary Current Smoker,    Pulmonary exam normal        Cardiovascular negative cardio ROS Normal cardiovascular exam     Neuro/Psych PSYCHIATRIC DISORDERS Anxiety Depression negative neurological ROS     GI/Hepatic negative GI ROS, Neg liver ROS,   Endo/Other  Morbid obesity  Renal/GU negative Renal ROS     Musculoskeletal negative musculoskeletal ROS (+)   Abdominal   Peds  Hematology negative hematology ROS (+)   Anesthesia Other Findings Day of surgery medications reviewed with the patient.  Reproductive/Obstetrics                            Anesthesia Physical Anesthesia Plan  ASA: III  Anesthesia Plan: General   Post-op Pain Management:  Regional for Post-op pain   Induction: Intravenous  PONV Risk Score and Plan: 2 and Ondansetron and Midazolam  Airway Management Planned: Oral ETT  Additional Equipment:   Intra-op Plan:   Post-operative Plan: Extubation in OR  Informed Consent: I have reviewed the patients History and Physical, chart, labs and discussed the procedure including the risks, benefits and alternatives for the proposed anesthesia with the patient or authorized representative who has indicated his/her understanding and acceptance.     Dental advisory given  Plan Discussed with: CRNA and Anesthesiologist  Anesthesia Plan Comments:       Anesthesia Quick Evaluation

## 2020-04-09 ENCOUNTER — Ambulatory Visit (HOSPITAL_COMMUNITY): Payer: Managed Care, Other (non HMO)

## 2020-04-09 ENCOUNTER — Ambulatory Visit (HOSPITAL_COMMUNITY): Payer: Managed Care, Other (non HMO) | Admitting: Certified Registered"

## 2020-04-09 ENCOUNTER — Encounter (HOSPITAL_COMMUNITY): Payer: Self-pay | Admitting: Orthopaedic Surgery

## 2020-04-09 ENCOUNTER — Encounter (HOSPITAL_COMMUNITY)
Admission: RE | Disposition: A | Discharge status: Home / Self Care | Payer: Self-pay | Source: Home / Self Care | Attending: Orthopaedic Surgery

## 2020-04-09 ENCOUNTER — Ambulatory Visit (HOSPITAL_COMMUNITY)
Admission: RE | Admit: 2020-04-09 | Discharge: 2020-04-09 | Disposition: A | Discharge status: Home / Self Care | Payer: Managed Care, Other (non HMO) | Attending: Orthopaedic Surgery | Admitting: Orthopaedic Surgery

## 2020-04-09 ENCOUNTER — Other Ambulatory Visit: Payer: Self-pay

## 2020-04-09 DIAGNOSIS — F17210 Nicotine dependence, cigarettes, uncomplicated: Secondary | ICD-10-CM | POA: Insufficient documentation

## 2020-04-09 DIAGNOSIS — Z09 Encounter for follow-up examination after completed treatment for conditions other than malignant neoplasm: Secondary | ICD-10-CM

## 2020-04-09 DIAGNOSIS — M24412 Recurrent dislocation, left shoulder: Secondary | ICD-10-CM | POA: Diagnosis not present

## 2020-04-09 DIAGNOSIS — Z419 Encounter for procedure for purposes other than remedying health state, unspecified: Secondary | ICD-10-CM

## 2020-04-09 HISTORY — PX: SHOULDER LATERJET: SHX6528

## 2020-04-09 SURGERY — REPAIR, SHOULDER, LATARJET
Anesthesia: General | Site: Shoulder | Laterality: Left

## 2020-04-09 MED ORDER — ONDANSETRON HCL 4 MG/2ML IJ SOLN
INTRAMUSCULAR | Status: DC | PRN
  Administered 2020-04-09: 4 mg via INTRAVENOUS

## 2020-04-09 MED ORDER — FENTANYL CITRATE (PF) 100 MCG/2ML IJ SOLN
INTRAMUSCULAR | Status: AC
  Filled 2020-04-09: qty 2

## 2020-04-09 MED ORDER — DEXAMETHASONE SODIUM PHOSPHATE 10 MG/ML IJ SOLN
INTRAMUSCULAR | Status: DC | PRN
  Administered 2020-04-09: 10 mg via INTRAVENOUS

## 2020-04-09 MED ORDER — ROCURONIUM BROMIDE 10 MG/ML (PF) SYRINGE
PREFILLED_SYRINGE | INTRAVENOUS | Status: AC
  Filled 2020-04-09: qty 10

## 2020-04-09 MED ORDER — PROPOFOL 10 MG/ML IV BOLUS
INTRAVENOUS | Status: AC
  Filled 2020-04-09: qty 40

## 2020-04-09 MED ORDER — ALBUTEROL SULFATE (2.5 MG/3ML) 0.083% IN NEBU
INHALATION_SOLUTION | RESPIRATORY_TRACT | Status: AC
  Filled 2020-04-09: qty 3

## 2020-04-09 MED ORDER — ESMOLOL HCL 100 MG/10ML IV SOLN
INTRAVENOUS | Status: DC | PRN
  Administered 2020-04-09: 15 mg via INTRAVENOUS

## 2020-04-09 MED ORDER — EPHEDRINE 5 MG/ML INJ
INTRAVENOUS | Status: AC
  Filled 2020-04-09: qty 10

## 2020-04-09 MED ORDER — PHENYLEPHRINE 40 MCG/ML (10ML) SYRINGE FOR IV PUSH (FOR BLOOD PRESSURE SUPPORT)
PREFILLED_SYRINGE | INTRAVENOUS | Status: DC | PRN
  Administered 2020-04-09 (×4): 120 ug via INTRAVENOUS
  Administered 2020-04-09: 80 ug via INTRAVENOUS
  Administered 2020-04-09: 120 ug via INTRAVENOUS
  Administered 2020-04-09: 80 ug via INTRAVENOUS
  Administered 2020-04-09: 120 ug via INTRAVENOUS

## 2020-04-09 MED ORDER — ACETAMINOPHEN 500 MG PO TABS
1000.0000 mg | ORAL_TABLET | Freq: Once | ORAL | Status: AC
  Administered 2020-04-09: 07:00:00 1000 mg via ORAL
  Filled 2020-04-09: qty 2

## 2020-04-09 MED ORDER — PHENYLEPHRINE 40 MCG/ML (10ML) SYRINGE FOR IV PUSH (FOR BLOOD PRESSURE SUPPORT)
PREFILLED_SYRINGE | INTRAVENOUS | Status: AC
  Filled 2020-04-09: qty 10

## 2020-04-09 MED ORDER — PROPOFOL 10 MG/ML IV BOLUS
INTRAVENOUS | Status: DC | PRN
  Administered 2020-04-09: 200 mg via INTRAVENOUS

## 2020-04-09 MED ORDER — ACETAMINOPHEN 500 MG PO TABS: 1000.0000 mg | ORAL_TABLET | Freq: Three times a day (TID) | ORAL | 0 refills | Status: AC

## 2020-04-09 MED ORDER — ONDANSETRON HCL 4 MG PO TABS: 4.0000 mg | ORAL_TABLET | Freq: Three times a day (TID) | ORAL | 1 refills | Status: AC | PRN

## 2020-04-09 MED ORDER — CHLORHEXIDINE GLUCONATE 4 % EX LIQD: 60.0000 mL | Freq: Once | CUTANEOUS | Status: DC

## 2020-04-09 MED ORDER — DEXAMETHASONE SODIUM PHOSPHATE 10 MG/ML IJ SOLN
INTRAMUSCULAR | Status: AC
  Filled 2020-04-09: qty 1

## 2020-04-09 MED ORDER — ESMOLOL HCL 100 MG/10ML IV SOLN
INTRAVENOUS | Status: AC
  Filled 2020-04-09: qty 10

## 2020-04-09 MED ORDER — EPHEDRINE SULFATE-NACL 50-0.9 MG/10ML-% IV SOSY
PREFILLED_SYRINGE | INTRAVENOUS | Status: DC | PRN
  Administered 2020-04-09: 5 mg via INTRAVENOUS
  Administered 2020-04-09: 10 mg via INTRAVENOUS

## 2020-04-09 MED ORDER — IPRATROPIUM-ALBUTEROL 0.5-2.5 (3) MG/3ML IN SOLN
3.0000 mL | RESPIRATORY_TRACT | Status: DC
  Filled 2020-04-09 (×2): qty 3

## 2020-04-09 MED ORDER — PHENYLEPHRINE 40 MCG/ML (10ML) SYRINGE FOR IV PUSH (FOR BLOOD PRESSURE SUPPORT)
PREFILLED_SYRINGE | INTRAVENOUS | Status: AC
  Filled 2020-04-09: qty 20

## 2020-04-09 MED ORDER — FENTANYL CITRATE (PF) 100 MCG/2ML IJ SOLN: 25.0000 ug | INTRAMUSCULAR | Status: DC | PRN

## 2020-04-09 MED ORDER — LIDOCAINE 2% (20 MG/ML) 5 ML SYRINGE
INTRAMUSCULAR | Status: AC
  Filled 2020-04-09: qty 5

## 2020-04-09 MED ORDER — PHENYLEPHRINE HCL-NACL 10-0.9 MG/250ML-% IV SOLN
INTRAVENOUS | Status: DC | PRN
  Administered 2020-04-09: 40 ug/min via INTRAVENOUS

## 2020-04-09 MED ORDER — CELECOXIB 200 MG PO CAPS
400.0000 mg | ORAL_CAPSULE | Freq: Once | ORAL | Status: AC
  Administered 2020-04-09: 07:00:00 400 mg via ORAL
  Filled 2020-04-09: qty 2

## 2020-04-09 MED ORDER — CEFAZOLIN SODIUM-DEXTROSE 2-4 GM/100ML-% IV SOLN
2.0000 g | INTRAVENOUS | Status: AC
  Administered 2020-04-09: 11:00:00 2 g via INTRAVENOUS
  Filled 2020-04-09: qty 100

## 2020-04-09 MED ORDER — SODIUM CHLORIDE 0.9 % IR SOLN
Status: DC | PRN
  Administered 2020-04-09: 1000 mL

## 2020-04-09 MED ORDER — OXYCODONE HCL 5 MG PO TABS: ORAL_TABLET | ORAL | 0 refills | Status: AC

## 2020-04-09 MED ORDER — PROPOFOL 10 MG/ML IV BOLUS
INTRAVENOUS | Status: AC
  Filled 2020-04-09: qty 20

## 2020-04-09 MED ORDER — PROMETHAZINE HCL 25 MG/ML IJ SOLN: 6.2500 mg | INTRAMUSCULAR | Status: DC | PRN

## 2020-04-09 MED ORDER — MIDAZOLAM HCL 2 MG/2ML IJ SOLN
INTRAMUSCULAR | Status: AC
  Filled 2020-04-09: qty 2

## 2020-04-09 MED ORDER — VANCOMYCIN HCL 1000 MG IV SOLR
INTRAVENOUS | Status: AC
  Filled 2020-04-09: qty 2000

## 2020-04-09 MED ORDER — LIDOCAINE 2% (20 MG/ML) 5 ML SYRINGE
INTRAMUSCULAR | Status: DC | PRN
  Administered 2020-04-09: 50 mg via INTRAVENOUS

## 2020-04-09 MED ORDER — ONDANSETRON HCL 4 MG/2ML IJ SOLN
INTRAMUSCULAR | Status: AC
  Filled 2020-04-09: qty 2

## 2020-04-09 MED ORDER — BUPIVACAINE HCL (PF) 0.5 % IJ SOLN
INTRAMUSCULAR | Status: DC | PRN
  Administered 2020-04-09: 15 mL

## 2020-04-09 MED ORDER — ALBUTEROL SULFATE HFA 108 (90 BASE) MCG/ACT IN AERS
INHALATION_SPRAY | RESPIRATORY_TRACT | Status: DC | PRN
  Administered 2020-04-09 (×2): 2 via RESPIRATORY_TRACT

## 2020-04-09 MED ORDER — VANCOMYCIN HCL 1000 MG IV SOLR
INTRAVENOUS | Status: DC | PRN
  Administered 2020-04-09: 1000 mg via TOPICAL

## 2020-04-09 MED ORDER — PHENYLEPHRINE HCL (PRESSORS) 10 MG/ML IV SOLN
INTRAVENOUS | Status: AC
  Filled 2020-04-09: qty 1

## 2020-04-09 MED ORDER — MIDAZOLAM HCL 2 MG/2ML IJ SOLN
INTRAMUSCULAR | Status: DC | PRN
  Administered 2020-04-09 (×2): 2 mg via INTRAVENOUS

## 2020-04-09 MED ORDER — FENTANYL CITRATE (PF) 250 MCG/5ML IJ SOLN
INTRAMUSCULAR | Status: DC | PRN
  Administered 2020-04-09: 100 ug via INTRAVENOUS
  Administered 2020-04-09 (×4): 50 ug via INTRAVENOUS

## 2020-04-09 MED ORDER — LACTATED RINGERS IV SOLN: INTRAVENOUS | Status: DC

## 2020-04-09 MED ORDER — BUPIVACAINE LIPOSOME 1.3 % IJ SUSP
INTRAMUSCULAR | Status: DC | PRN
  Administered 2020-04-09: 10 mL

## 2020-04-09 MED ORDER — ROCURONIUM BROMIDE 10 MG/ML (PF) SYRINGE
PREFILLED_SYRINGE | INTRAVENOUS | Status: DC | PRN
  Administered 2020-04-09: 60 mg via INTRAVENOUS
  Administered 2020-04-09: 10 mg via INTRAVENOUS
  Administered 2020-04-09: 20 mg via INTRAVENOUS

## 2020-04-09 MED ORDER — MELOXICAM 7.5 MG PO TABS: 7.5000 mg | ORAL_TABLET | Freq: Every day | ORAL | 2 refills | Status: AC

## 2020-04-09 SURGICAL SUPPLY — 75 items
BIT DRILL 2.75 .066 CANNLTION (DRILL) ×1 IMPLANT
BIT DRILL NON CANNULATED 4MM (DRILL) ×1 IMPLANT
BLADE EXTENDED COATED 6.5IN (ELECTRODE) ×3 IMPLANT
BLADE SAW SAG 19X10X0.6 (BLADE) ×2 IMPLANT
BLADE SAW SAG 19X10X0.6MM (BLADE) ×1
BLADE SAW SAG 29X58X.64 (BLADE) IMPLANT
BLADE SAW SAG 73X25 THK (BLADE)
BLADE SAW SGTL 73X25 THK (BLADE) IMPLANT
BUR EGG DIAMOND 4.0 (BURR) IMPLANT
BUR EGG DIAMOND 4.0MM (BURR)
BUR EGG ELITE 4.0 (BURR) IMPLANT
BUR EGG ELITE 4.0MM (BURR)
BUR OVAL CARBIDE 4.0 (BURR) ×3 IMPLANT
CHLORAPREP W/TINT 26 (MISCELLANEOUS) ×6 IMPLANT
CLOSURE STERI-STRIP 1/2X4 (GAUZE/BANDAGES/DRESSINGS) ×2
CLOSURE WOUND 1/2 X4 (GAUZE/BANDAGES/DRESSINGS) ×1
CLSR STERI-STRIP ANTIMIC 1/2X4 (GAUZE/BANDAGES/DRESSINGS) ×4 IMPLANT
COOLER ICEMAN CLASSIC (MISCELLANEOUS) IMPLANT
COVER BACK TABLE 60X90IN (DRAPES) ×3 IMPLANT
COVER SURGICAL LIGHT HANDLE (MISCELLANEOUS) ×3 IMPLANT
COVER WAND RF STERILE (DRAPES) ×3 IMPLANT
DRAPE C-ARM 42X120 X-RAY (DRAPES) IMPLANT
DRAPE INCISE IOBAN 66X45 STRL (DRAPES) ×9 IMPLANT
DRAPE ORTHO SPLIT 77X108 STRL (DRAPES) ×6
DRAPE SHEET LG 3/4 BI-LAMINATE (DRAPES) ×6 IMPLANT
DRAPE SURG ORHT 6 SPLT 77X108 (DRAPES) ×2 IMPLANT
DRILL 2.75 .066 CANNULATION (DRILL) ×3
DRILL NON CANNULATED 4MM (DRILL) ×3
DRSG AQUACEL AG ADV 3.5X 6 (GAUZE/BANDAGES/DRESSINGS) ×3 IMPLANT
ELECT REM PT RETURN 15FT ADLT (MISCELLANEOUS) ×6 IMPLANT
GLOVE BIO SURGEON STRL SZ 6.5 (GLOVE) ×2 IMPLANT
GLOVE BIO SURGEON STRL SZ7 (GLOVE) ×9 IMPLANT
GLOVE BIO SURGEONS STRL SZ 6.5 (GLOVE) ×1
GLOVE BIOGEL PI IND STRL 6.5 (GLOVE) ×1 IMPLANT
GLOVE BIOGEL PI IND STRL 7.0 (GLOVE) ×2 IMPLANT
GLOVE BIOGEL PI IND STRL 8 (GLOVE) ×3 IMPLANT
GLOVE BIOGEL PI INDICATOR 6.5 (GLOVE) ×2
GLOVE BIOGEL PI INDICATOR 7.0 (GLOVE) ×4
GLOVE BIOGEL PI INDICATOR 8 (GLOVE) ×6
GLOVE ECLIPSE 8.0 STRL XLNG CF (GLOVE) ×9 IMPLANT
GOWN STRL REUS W/ TWL LRG LVL3 (GOWN DISPOSABLE) ×3 IMPLANT
GOWN STRL REUS W/TWL LRG LVL3 (GOWN DISPOSABLE) ×15 IMPLANT
GUIDEWIRE .062X12IN LONG (WIRE) ×6 IMPLANT
GUIDEWIRE .062X6IN LONG (WIRE) ×6 IMPLANT
GUIDEWIRE .062X7IN LONG (WIRE) ×6 IMPLANT
KIT BASIN OR (CUSTOM PROCEDURE TRAY) ×6 IMPLANT
KIT SHOULDER TRACTION (DRAPES) IMPLANT
KIT STABILIZATION SHOULDER (MISCELLANEOUS) ×3 IMPLANT
KIT TURNOVER KIT A (KITS) ×3 IMPLANT
MANIFOLD NEPTUNE II (INSTRUMENTS) ×3 IMPLANT
NS IRRIG 1000ML POUR BTL (IV SOLUTION) ×3 IMPLANT
PACK SHOULDER (CUSTOM PROCEDURE TRAY) ×3 IMPLANT
PAD COLD SHLDR WRAP-ON (PAD) ×3 IMPLANT
PENCIL SMOKE EVACUATOR (MISCELLANEOUS) ×3 IMPLANT
PUTTY DBM STAGRAFT PLUS 10CC (Putty) ×3 IMPLANT
RESTRAINT HEAD UNIVERSAL NS (MISCELLANEOUS) ×3 IMPLANT
SCREW CANN F/T 3.75X32MM (Screw) ×3 IMPLANT
SCREW CANN F/T 3.75X34MM (Screw) ×3 IMPLANT
SLEEVE SCD COMPRESS KNEE MED (MISCELLANEOUS) ×3 IMPLANT
SLING ARM FOAM STRAP LRG (SOFTGOODS) IMPLANT
SLING ULTRA II L (ORTHOPEDIC SUPPLIES) ×3 IMPLANT
STRIP CLOSURE SKIN 1/2X4 (GAUZE/BANDAGES/DRESSINGS) ×2 IMPLANT
SUPPORT WRAP ARM LG (MISCELLANEOUS) ×3 IMPLANT
SUT ETHIBOND 2 V 37 (SUTURE) ×3 IMPLANT
SUT ETHIBOND NAB CT1 #1 30IN (SUTURE) ×3 IMPLANT
SUT FIBERWIRE #2 38 T-5 BLUE (SUTURE) ×3
SUT MNCRL AB 4-0 PS2 18 (SUTURE) ×3 IMPLANT
SUT VIC AB 0 CT1 36 (SUTURE) ×3 IMPLANT
SUT VIC AB 2-0 SH 18 (SUTURE) ×3 IMPLANT
SUT VIC AB 3-0 CT1 27 (SUTURE) ×3
SUT VIC AB 3-0 CT1 TAPERPNT 27 (SUTURE) ×1 IMPLANT
SUTURE FIBERWR #2 38 T-5 BLUE (SUTURE) ×1 IMPLANT
WASHER TI CANN F/3.75 DISP (Washer) ×6 IMPLANT
WATER STERILE IRR 1000ML POUR (IV SOLUTION) ×3 IMPLANT
screw (Screw) ×3 IMPLANT

## 2020-04-09 NOTE — Anesthesia Procedure Notes (Signed)
Procedure Name: Intubation Date/Time: 04/09/2020 11:30 AM Performed by: Silas [place], CRNA Pre-anesthesia Checklist: Patient identified, Emergency Drugs available, Suction available and Patient being monitored Patient Re-evaluated:Patient Re-evaluated prior to induction Oxygen Delivery Method: Circle system utilized Preoxygenation: Pre-oxygenation with 100% oxygen Induction Type: IV induction Ventilation: Mask ventilation without difficulty Laryngoscope Size: Mac and 4 Grade View: Grade II Tube type: Oral Tube size: 7.5 mm Number of attempts: 1 Airway Equipment and Method: Stylet Placement Confirmation: ETT inserted through vocal cords under direct vision,  positive ETCO2 and breath sounds checked- equal and bilateral Secured at: 23 cm Tube secured with: Tape Dental Injury: Teeth and Oropharynx as per pre-operative assessment

## 2020-04-09 NOTE — Interval H&P Note (Signed)
All questions answered

## 2020-04-09 NOTE — Transfer of Care (Signed)
Immediate Anesthesia Transfer of Care Note  Patient: Jeffrey Cox  Procedure(s) Performed: SHOULDER LATARJET (Left Shoulder)  Patient Location: PACU  Anesthesia Type:GA combined with regional for post-op pain  Level of Consciousness: drowsy, patient cooperative and responds to stimulation  Airway & Oxygen Therapy: Patient Spontanous Breathing and Patient connected to face mask oxygen  Post-op Assessment: Report given to RN and Post -op Vital signs reviewed and stable  Post vital signs: Reviewed and stable  Last Vitals:  Vitals Value Taken Time  BP 116/46 04/09/20 1427  Temp    Pulse 89 04/09/20 1429  Resp 27 04/09/20 1429  SpO2 99 % 04/09/20 1429  Vitals shown include unvalidated device data.  Last Pain:  Vitals:   04/09/20 1016  TempSrc:   PainSc: 0-No pain      Patients Stated Pain Goal: 5 (04/09/20 2820)  Complications: No apparent anesthesia complications

## 2020-04-09 NOTE — Anesthesia Postprocedure Evaluation (Signed)
Anesthesia Post Note  Patient: Jeffrey Cox  Procedure(s) Performed: SHOULDER LATARJET (Left Shoulder)     Patient location during evaluation: PACU Anesthesia Type: General Level of consciousness: sedated Pain management: pain level controlled Vital Signs Assessment: post-procedure vital signs reviewed and stable Respiratory status: spontaneous breathing and respiratory function stable Cardiovascular status: stable Postop Assessment: no apparent nausea or vomiting Anesthetic complications: no    Last Vitals:  Vitals:   04/09/20 1430 04/09/20 1445  BP: 108/72 (!) 116/97  Pulse: 89 100  Resp: (!) 27 (!) 26  Temp: (!) 35.9 C   SpO2: 99% 96%    Last Pain:  Vitals:   04/09/20 1445  TempSrc:   PainSc: (P) 0-No pain                 Saleemah Mollenhauer DANIEL

## 2020-04-09 NOTE — Interval H&P Note (Signed)
All questions were answered. Patient understands the risk for perioperative complication and continued instability as well as arthrosis.

## 2020-04-09 NOTE — Op Note (Signed)
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  Patient's joint had some early arthrosis however he had almost 40% of his anterior-inferior glenoid that was fractured and malunited medial about 1-1/2 cm from the articular surface.  This was not amenable to osteotomy and movement and thus we felt that using the coracoid was necessary in this patient with poor healing characteristics in the setting of substance abuse as well as smoking.  We had good fixation of our coracoid bone graft which measured 21 mm.  Patient had significant cysts within his subchondral space and we used a combination of autograft and allograft bone graft this area.  We performed a subscapularis split and repaired the subscapularis at the end of the case.  Post-operative plan: The patient will be NWB in sling.  The patient will be will be discharged from PACU if continues to be stable as was plan prior to surgery.  DVT prophylaxis not indicated in this ambulatory upper extremity patient without significant risk factors.  Pain control with PRN pain medication preferring oral medicines.  Follow up plan will be scheduled in approximately 7 days for incision check and XR.  Physical therapy to start after first visit.  Implants: Arthrex 3.75 mm cannulated screws x2  Post-Op Diagnosis: Same Surgeons:Primary: Bjorn Pippin, MD Assistants:Caroline McBane PA-C Location: [facility] ROOM 05 Anesthesia: General with Exparel Interscalene Antibiotics: Ancef 2g preop, Vancomycin 1000mg  locally Tourniquet time: None Estimated Blood Loss: 100 Complications: None Specimens: None Implants: Implant Name Type Inv. Item Serial No. Manufacturer Lot No. LRB No. Used Action  PUTTY DBM STAGRAFT  PLUS 10CC - [id number] Putty PUTTY DBM STAGRAFT PLUS 10CC [id number] [employer](ORTH,TRAU,BIO,SG) Left 1 Implanted  [id number] ARTHREX SCREW Screw  [id number] [employer]  Left 1 Implanted  WASHER TI CANN F/3.75 DISP - [id number] Washer WASHER TI CANN F/3.75 DISP [id number] [employer]  Left 2 Implanted  SCREW Screw  [id number]  [employer]  Left 1 Implanted  screw Screw  [id number] [employer]  Left 1 Implanted    Indications for Surgery:   Onesimo RAUNAK ANTUNA is a 49 y.o. male with continued anterior shoulder instability in the setting of remote fall and fracture of the glenoid.  Preoperative CT scan demonstrated anterior glenoid fracture that was malunited medial to the glenoid face.  Patient's remaining glenoid was not amenable to anatomic total shoulder arthroplasty secondary to bone loss and we felt that stabilization with a latjer procedure may provide the patient stability and build bone stock for eventual arthroplasty.  Benefits and risks of operative and nonoperative management were discussed prior to surgery with patient/guardian(s) and informed consent form was completed.  We discussed infection, postoperative arthrosis, periprosthetic fracture, loss of fixation and nonunion as well as malunion.  Cuff failure was also discussed.    Procedure:   The patient was identified in the preoperative holding area where the surgical site was marked. Block placed by anesthesia with exparel.  The patient was taken to the OR where a procedural timeout was called and the above noted anesthesia was induced.  The patient was positioned beachchair on allen table with spider arm positioner.  Preoperative antibiotics were dosed.  The patient's left shoulder was prepped and draped in the usual sterile fashion.  A second preoperative timeout was called.  We began with a deltopectoral incision.  Went through skin sharply achieving hemostasis we progressed.  We identified the cephalic vein and coagulated any  branches and retracted it laterally.  At that point were able to identify the clavipectoral fascia and the interval between the pectoralis and the deltoid.  We open the clavipectoral fascia and placed a self-retaining Covell retractor.  We are able to identify the pectoralis and released the anterior 10% or so of this as well as the falciform ligament.  We then identified the subscapularis and made a horizontal split in line with the fibers at the inferior 1/3-3rd junction.  Once this was complete we were able to open the capsule which was very thin and expose the joint.  The fragment of bone that we had identified on CT was sclerotic and malunited medially and we did not feel was amenable to repair.  We closed our subscapularis split and turned our attention to the coracoid harvest.  We exposed the coracoid both superiorly as well as medial and lateral.  Placed retractors superiorly and blunt retractors in the soft tissue to expose the medial border.  The pectoralis minor was released off the medial border.  We released the coracohumeral ligament as well as the coracoacromial ligament from the lateral border.  Once this was done we were able to protect the soft tissue structures and marked 2 cm from the tip of the coracoid.  We then used a 90 degree Arthrex saw to harvest the coracoid in standard fashion.  Once it was harvested we prepared the medial side for bone growth with a small bur planing it to a flat surface.  We used the Arthrex guide to drill to parallel holes.  Once we identified that our holes were appropriate we turned our attention back to the subscapularis.  A Fukuda retractor was placed to posteriorly dislocate the head.  We then used a inferior Hohmann retractor, medial anterior glenoid retractor and a superior K wire which was bent to expose the anterior inferior aspect of the glenoid.  The malunited glenoid fragment was burred flat at its lateral most aspect to provide space for our  coracoid transfer.  We harvested the bone graft from this for eventual use.  We identified that the prepared surface of the glenoid had significant subchondral cysts and we used autograft as well as allograft DBM to fill the cysts.  We had appropriate position and then we used the Arthrex guide system to place 2 cannulated screws through our coracoid graft in the appropriate position on the anterior inferior aspect of the glenoid.  Once this was visualized to be in appropriate position we predrilled our holes and placed 2 cannulated screws with washers.  We had good purchase with each screw.  There was a small step-off that was filled with additional mix of autograft and allograft bone.  We then were happy with her overall construct.  We then withdrew our retractors and took final fluoroscopic images which demonstrated appropriate position of the graft as well as screws.  No screws were visualized directly in the joint through direct visualization.  Retractors were withdrawn after irrigating copiously.  We are able to close the subscapularis with side to side sutures.  Conjoined tendon was protected throughout.  Irrigation was performed again and local vancomycin powder was placed.  Skin was closed in a multilayer fashion with absorbable sutures and an Aquacel dressing was placed.  Sling was placed.  Patient was woken taken to PACU in stable  condition.   Alfonse Alpers, PA-C, present and scrubbed throughout the case, critical for completion in a timely fashion, and for retraction, instrumentation, closure.

## 2020-04-09 NOTE — OR Nursing (Signed)
[id number],  Patient was transferred to OR 6. Staff was notified from [facility] that trays needed to do procedure was ready. Spoke with MD and decided to switch cases around and bring patient back after second case is done. Patient has returned to Short stay awaiting OR time.  Arthor Captain

## 2020-04-09 NOTE — OR Nursing (Signed)
Earlier today, patient presented to OR 6 but was delayed due to an instrumentation delay. Surgeon was made aware and patient was taken back to the Short Stay Unit until the instrumentation was processed. Surgeon Made aware. This case was later performed in OR 5 as Dr. Austin Miles second case instead of his first case. I spoke with patient and apologized for our delays. Charting was continued in OR 5 by Velta Addison RN.

## 2020-04-09 NOTE — Anesthesia Procedure Notes (Signed)
Anesthesia Regional Block: Interscalene brachial plexus block   Pre-Anesthetic Checklist: ,, timeout performed, Correct Patient, Correct Site, Correct Laterality, Correct Procedure, Correct Position, site marked, Risks and benefits discussed,  Surgical consent,  Pre-op evaluation,  At surgeon's request and post-op pain management  Laterality: Left  Prep: chloraprep       Needles:  Injection technique: Single-shot  Needle Type: Echogenic Stimulator Needle     Needle Length: 5cm  Needle Gauge: 22     Additional Needles:   Narrative:  Start time: 04/09/2020 8:01 AM End time: 04/09/2020 8:11 AM Injection made incrementally with aspirations every 5 mL.  Performed by: Personally  Anesthesiologist: Heather Roberts, MD  Additional Notes: Functioning IV was confirmed and monitors applied.  A 89mm 22ga echogenic arrow stimulator was used. Sterile prep and drape,hand hygiene and sterile gloves were used.Ultrasound guidance: relevant anatomy identified, needle position confirmed, local anesthetic spread visualized around nerve(s)., vascular puncture avoided.  Image printed for medical record.  Negative aspiration and negative test dose prior to incremental administration of local anesthetic. The patient tolerated the procedure well.

## 2020-04-14 ENCOUNTER — Encounter: Payer: Self-pay | Admitting: *Deleted

## 2020-04-19 ENCOUNTER — Emergency Department (HOSPITAL_COMMUNITY): Payer: Managed Care, Other (non HMO)

## 2020-04-19 ENCOUNTER — Emergency Department (HOSPITAL_COMMUNITY)
Admission: EM | Admit: 2020-04-19 | Discharge: 2020-04-19 | Disposition: A | Discharge status: Home / Self Care | Payer: Managed Care, Other (non HMO) | Attending: Emergency Medicine | Admitting: Emergency Medicine

## 2020-04-19 ENCOUNTER — Other Ambulatory Visit: Payer: Self-pay

## 2020-04-19 DIAGNOSIS — F17210 Nicotine dependence, cigarettes, uncomplicated: Secondary | ICD-10-CM | POA: Insufficient documentation

## 2020-04-19 DIAGNOSIS — Z79899 Other long term (current) drug therapy: Secondary | ICD-10-CM | POA: Insufficient documentation

## 2020-04-19 DIAGNOSIS — G8918 Other acute postprocedural pain: Secondary | ICD-10-CM | POA: Diagnosis not present

## 2020-04-19 DIAGNOSIS — R0789 Other chest pain: Secondary | ICD-10-CM | POA: Diagnosis not present

## 2020-04-19 LAB — BASIC METABOLIC PANEL
Anion gap: 9 (ref 5–15)
BUN: 21 mg/dL — ABNORMAL HIGH (ref 6–20)
CO2: 23 mmol/L (ref 22–32)
Calcium: 9.5 mg/dL (ref 8.9–10.3)
Chloride: 105 mmol/L (ref 98–111)
Creatinine, Ser: 0.95 mg/dL (ref 0.61–1.24)
GFR calc Af Amer: >60 mL/min (ref >60)
GFR calc non Af Amer: >60 mL/min (ref >60)
Glucose, Bld: 125 mg/dL — ABNORMAL HIGH (ref 70–99)
Potassium: 4.4 mmol/L (ref 3.5–5.1)
Sodium: 137 mmol/L (ref 135–145)

## 2020-04-19 LAB — CBC
HCT: 45.3 % (ref 39.0–52.0)
Hemoglobin: 14.7 g/dL (ref 13.0–17.0)
MCH: 30.8 pg (ref 26.0–34.0)
MCHC: 32.5 g/dL (ref 30.0–36.0)
MCV: 95 fL (ref 80.0–100.0)
Platelets: 417 10*3/uL — ABNORMAL HIGH (ref 150–400)
RBC: 4.77 MIL/uL (ref 4.22–5.81)
RDW: 11.9 % (ref 11.5–15.5)
WBC: 13.2 10*3/uL — ABNORMAL HIGH (ref 4.0–10.5)
nRBC: 0 % (ref 0.0–0.2)

## 2020-04-19 LAB — TROPONIN I (HIGH SENSITIVITY)
Troponin I (High Sensitivity): 5 ng/L (ref <18)
Troponin I (High Sensitivity): 5 ng/L (ref <18)

## 2020-04-19 MED ORDER — IOHEXOL 350 MG/ML SOLN
100.0000 mL | Freq: Once | INTRAVENOUS | Status: AC | PRN
  Administered 2020-04-19: 16:00:00 100 mL via INTRAVENOUS

## 2020-04-19 MED ORDER — SODIUM CHLORIDE (PF) 0.9 % IJ SOLN
INTRAMUSCULAR | Status: AC
  Filled 2020-04-19: qty 50

## 2020-04-19 MED ORDER — NITROGLYCERIN 0.4 MG SL SUBL: 0.4000 mg | SUBLINGUAL_TABLET | SUBLINGUAL | Status: DC | PRN

## 2020-04-19 MED ORDER — ASPIRIN 81 MG PO CHEW
324.0000 mg | CHEWABLE_TABLET | Freq: Once | ORAL | Status: AC
  Administered 2020-04-19: 324 mg via ORAL
  Filled 2020-04-19: qty 4

## 2020-04-19 MED ORDER — MORPHINE SULFATE (PF) 4 MG/ML IV SOLN
4.0000 mg | Freq: Once | INTRAVENOUS | Status: AC
  Administered 2020-04-19: 4 mg via INTRAVENOUS
  Filled 2020-04-19: qty 1
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
## 2020-04-19 NOTE — ED Triage Notes (Signed)
Patient presents to ED with c/o intermittent left sided chest pain - started 3 days ago - felt tight to begin with but has eased up some. Denies injury. Patient had left shoulder replacement surgery 11 days ago.

## 2020-04-19 NOTE — Discharge Instructions (Signed)
You have been evaluated for your chest pain.  Your pain is likely related to previous left shoulder surgery.  Fortunately no obvious signs of blood clot in your lung and no significant finding to suggest a heart attack.  Please call and follow-up closely with your orthopedist next week for further care.  Return if you have any concern or if your condition worsen.

## 2020-11-07 ENCOUNTER — Emergency Department (HOSPITAL_COMMUNITY)
Admission: EM | Admit: 2020-11-07 | Discharge: 2020-11-07 | Disposition: A | Discharge status: Home / Self Care | Payer: Managed Care, Other (non HMO) | Attending: Emergency Medicine | Admitting: Emergency Medicine

## 2020-11-07 ENCOUNTER — Emergency Department (HOSPITAL_COMMUNITY): Payer: Managed Care, Other (non HMO)

## 2020-11-07 ENCOUNTER — Other Ambulatory Visit: Payer: Self-pay

## 2020-11-07 DIAGNOSIS — M25512 Pain in left shoulder: Secondary | ICD-10-CM | POA: Diagnosis not present

## 2020-11-07 DIAGNOSIS — F17210 Nicotine dependence, cigarettes, uncomplicated: Secondary | ICD-10-CM | POA: Diagnosis not present

## 2020-11-07 MED ORDER — LIDOCAINE 5 % EX PTCH
1.0000 | MEDICATED_PATCH | Freq: Once | CUTANEOUS | Status: DC
  Administered 2020-11-07: 1 via TRANSDERMAL
  Filled 2020-11-07: qty 1

## 2020-11-07 MED ORDER — GABAPENTIN 300 MG PO CAPS: 300.0000 mg | ORAL_CAPSULE | Freq: Three times a day (TID) | ORAL | 0 refills | Status: DC

## 2020-11-07 NOTE — ED Triage Notes (Signed)
Pt presents with Left shoulder pain x2 weeks, recent surgery 6 months ago, states it feels like the screw is moving around

## 2020-11-07 NOTE — ED Notes (Signed)
Patient verbalized understanding of discharge instructions. Opportunity for questions and answers.  
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
## 2021-03-09 ENCOUNTER — Encounter: Payer: Managed Care, Other (non HMO) | Admitting: Medical

## 2021-04-15 ENCOUNTER — Other Ambulatory Visit: Payer: Self-pay

## 2021-04-15 ENCOUNTER — Emergency Department (HOSPITAL_COMMUNITY)
Admission: EM | Admit: 2021-04-15 | Discharge: 2021-04-16 | Disposition: A | Discharge status: Home / Self Care | Payer: 59 | Attending: Emergency Medicine | Admitting: Emergency Medicine

## 2021-04-15 ENCOUNTER — Encounter (HOSPITAL_COMMUNITY): Payer: Self-pay

## 2021-04-15 ENCOUNTER — Emergency Department (HOSPITAL_COMMUNITY): Payer: 59

## 2021-04-15 DIAGNOSIS — M79622 Pain in left upper arm: Secondary | ICD-10-CM | POA: Diagnosis present

## 2021-04-15 DIAGNOSIS — M79602 Pain in left arm: Secondary | ICD-10-CM

## 2021-04-15 DIAGNOSIS — F17210 Nicotine dependence, cigarettes, uncomplicated: Secondary | ICD-10-CM | POA: Insufficient documentation

## 2021-04-15 DIAGNOSIS — Z7982 Long term (current) use of aspirin: Secondary | ICD-10-CM | POA: Diagnosis not present

## 2021-04-15 MED ORDER — OXYCODONE-ACETAMINOPHEN 5-325 MG PO TABS
1.0000 | ORAL_TABLET | Freq: Once | ORAL | Status: AC
  Administered 2021-04-16: 1 via ORAL
  Filled 2021-04-15: qty 1

## 2021-04-15 MED ORDER — KETOROLAC TROMETHAMINE 60 MG/2ML IM SOLN
30.0000 mg | Freq: Once | INTRAMUSCULAR | Status: AC
  Administered 2021-04-16: 30 mg via INTRAMUSCULAR
  Filled 2021-04-15: qty 2

## 2021-04-15 MED ORDER — METHOCARBAMOL 500 MG PO TABS
500.0000 mg | ORAL_TABLET | Freq: Once | ORAL | Status: AC
  Administered 2021-04-16: 500 mg via ORAL
  Filled 2021-04-15: qty 1

## 2021-04-15 NOTE — ED Triage Notes (Signed)
Pt complains of left arm pain from the shoulder down, no new injury, but does have screws in that arm He states that tonight the pain is worse and it hurts to lift it all the way up

## 2021-04-16 MED ORDER — METHOCARBAMOL 500 MG PO TABS: 500.0000 mg | ORAL_TABLET | Freq: Two times a day (BID) | ORAL | 0 refills | Status: DC

## 2021-04-16 MED ORDER — IBUPROFEN 800 MG PO TABS: 800.0000 mg | ORAL_TABLET | Freq: Three times a day (TID) | ORAL | 0 refills | Status: DC
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
## 2021-04-21 ENCOUNTER — Encounter (HOSPITAL_COMMUNITY): Payer: Self-pay | Admitting: *Deleted

## 2021-04-21 ENCOUNTER — Emergency Department (HOSPITAL_COMMUNITY)
Admission: EM | Admit: 2021-04-21 | Discharge: 2021-04-21 | Disposition: A | Discharge status: Home / Self Care | Payer: 59 | Attending: Emergency Medicine | Admitting: Emergency Medicine

## 2021-04-21 DIAGNOSIS — Z7982 Long term (current) use of aspirin: Secondary | ICD-10-CM | POA: Insufficient documentation

## 2021-04-21 DIAGNOSIS — Z0279 Encounter for issue of other medical certificate: Secondary | ICD-10-CM | POA: Diagnosis not present

## 2021-04-21 DIAGNOSIS — Z7689 Persons encountering health services in other specified circumstances: Secondary | ICD-10-CM

## 2021-04-21 DIAGNOSIS — F17210 Nicotine dependence, cigarettes, uncomplicated: Secondary | ICD-10-CM | POA: Insufficient documentation

## 2021-04-21 NOTE — ED Notes (Signed)
An After Visit Summary was printed and given to the patient. Discharge instructions given and no further questions at this time.  

## 2021-04-21 NOTE — Discharge Instructions (Signed)
You may return to work today.  Continue treating left arm supportively and if you have worsening pain follow-up as an outpatient.
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
## 2021-04-21 NOTE — ED Triage Notes (Signed)
Pt was given work note last week but did not have return date on it. He needs note saying he can go to work.

## 2021-05-06 ENCOUNTER — Ambulatory Visit (INDEPENDENT_AMBULATORY_CARE_PROVIDER_SITE_OTHER): Payer: 59 | Admitting: Orthopedic Surgery

## 2021-05-06 ENCOUNTER — Encounter: Payer: Self-pay | Admitting: Orthopedic Surgery

## 2021-05-06 DIAGNOSIS — S4292XK Fracture of left shoulder girdle, part unspecified, subsequent encounter for fracture with nonunion: Secondary | ICD-10-CM | POA: Diagnosis not present
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
## 2021-06-23 ENCOUNTER — Encounter: Payer: Self-pay | Admitting: Orthopedic Surgery

## 2021-07-06 IMAGING — CT CT SHOULDER*L* W/O CM
1 of 2 series · 9 of 14 positions shown, 12 images · non-contrast
Comparison: X-ray 02/13/2020

CLINICAL DATA: Left shoulder dislocation 1 month ago, limited range
of motion

EXAM:
CT OF THE UPPER LEFT EXTREMITY WITHOUT CONTRAST
TECHNIQUE: Multidetector CT imaging of the upper left extremity was performed
according to the standard protocol.

[Series 14: shoulder 0.60 br40 s3 thins soft · axial · 0.58mm/px · z∈[-918,-769]mm · 9 of 311 slices shown, 12 images]
[im 32/311  soft-tissue]
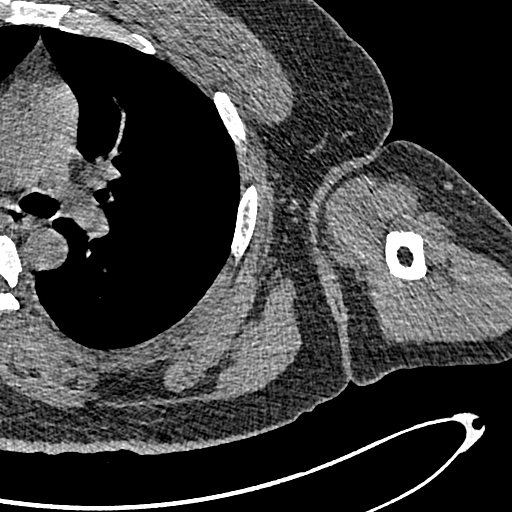
[im 32/311  bone]
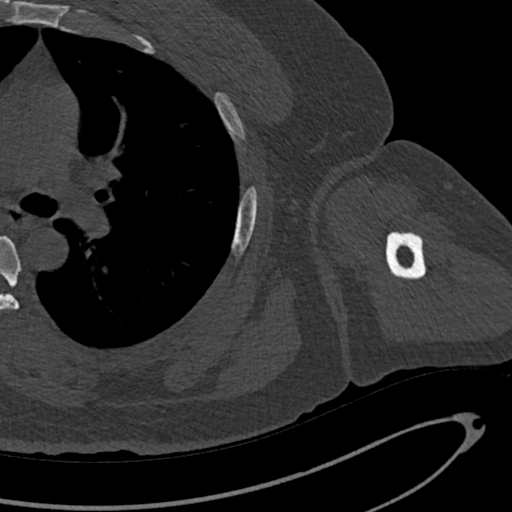
[im 63/311  bone]
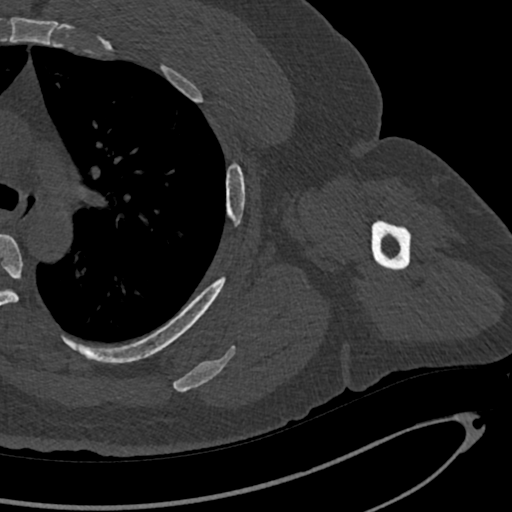
[im 94/311  bone]
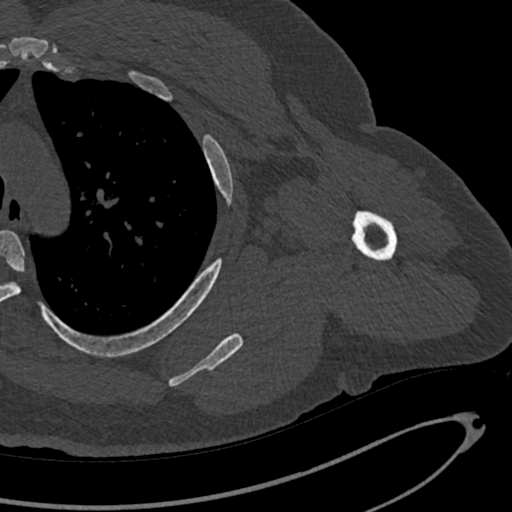
[im 125/311  bone]
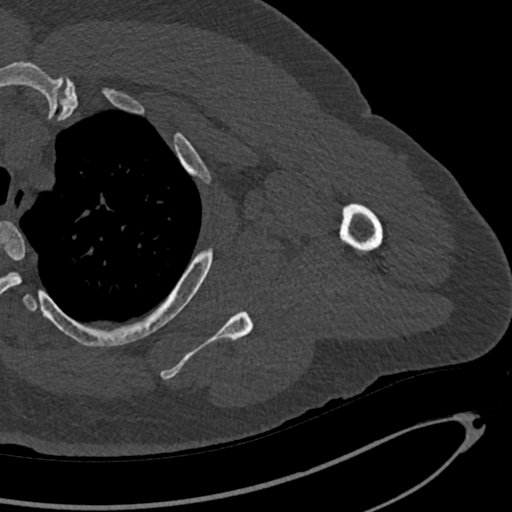
[im 156/311  soft-tissue]
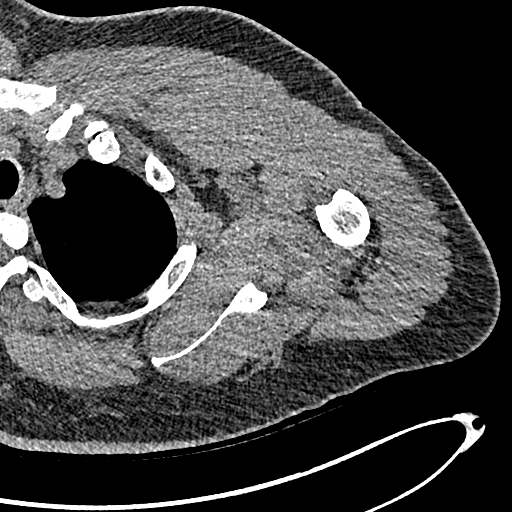
[im 156/311  bone]
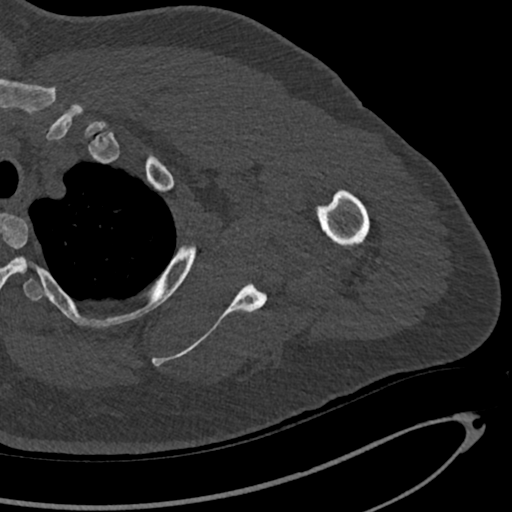
[im 187/311  bone]
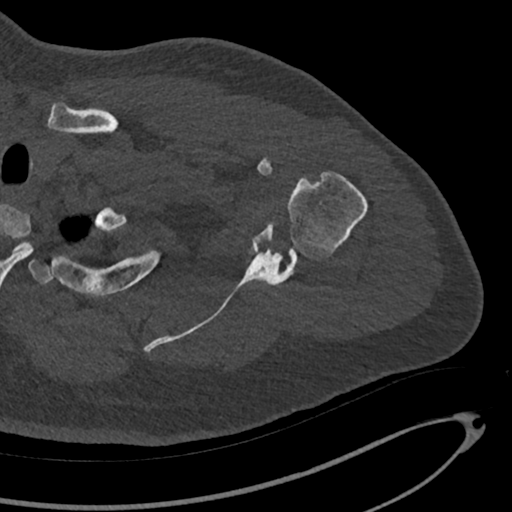
[im 218/311  bone]
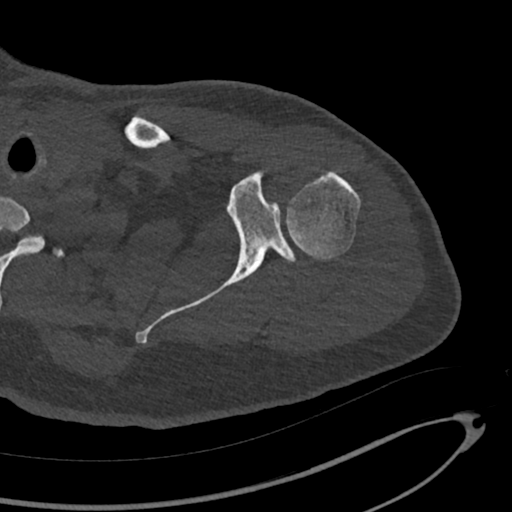
[im 249/311  bone]
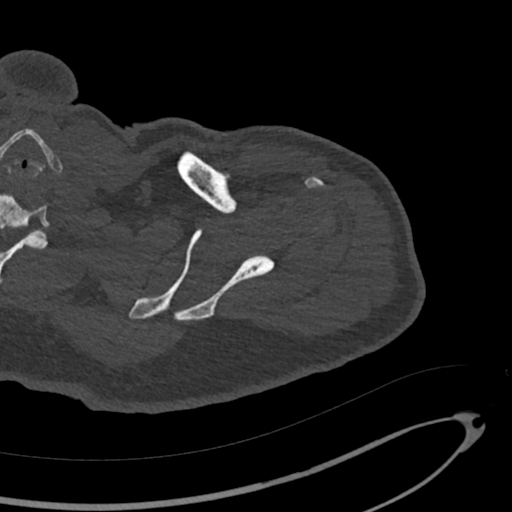
[im 280/311  soft-tissue]
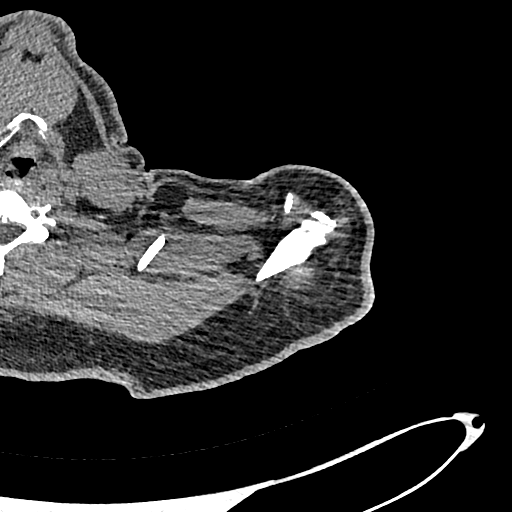
[im 280/311  bone]
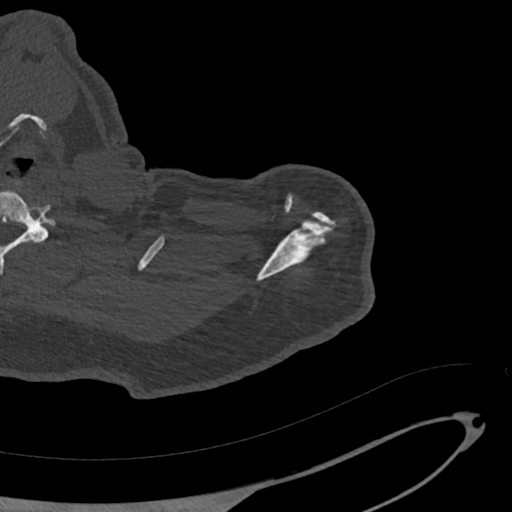

[9 of 14 positions shown; findings below may reference images not displayed]

FINDINGS: Bones/Joint/Cartilage

Large mildly comminuted fracture fragment of the anteroinferior
glenoid measuring 2.5 x 0.9 x 1.3 cm (series 12, image 79; series 8,
image 43). Osseous fragment is displaced anteromedially by 3-4 mm.
Fracture results in a large articular surface defect at the inferior
glenoid (series 4, image 39). Prominent subchondral sclerosis with
large cysts in the mid to inferior glenoid. Mild contour deformity
of the superolateral aspect of the humeral head compatible with
Hill-Sachs impaction deformity. Glenohumeral joint alignment is
maintained without dislocation. Os acromiale noted. AC joint intact.
Small to moderate-sized glenohumeral joint effusion.

Ligaments

Suboptimally assessed by CT.

Muscles and Tendons

Rotator cuff muscle bulk is preserved. No obvious rotator cuff
tendon tear by CT.

Soft tissues

No soft tissue fluid collection or hematoma. Visualized left lung is
clear. No left axillary lymphadenopathy.
IMPRESSION: 1. Sequela of prior anterior left shoulder dislocation with large
mildly displaced bony Bankart fracture fragment and associated
Hill-Sachs impaction deformity.
2. Small to moderate-sized glenohumeral joint effusion.

## 2021-07-11 ENCOUNTER — Other Ambulatory Visit: Payer: Self-pay

## 2021-07-11 ENCOUNTER — Ambulatory Visit
Admission: RE | Admit: 2021-07-11 | Discharge: 2021-07-11 | Disposition: A | Discharge status: Home / Self Care | Payer: 59 | Source: Ambulatory Visit | Attending: Surgical | Admitting: Surgical

## 2021-07-11 DIAGNOSIS — S4292XK Fracture of left shoulder girdle, part unspecified, subsequent encounter for fracture with nonunion: Secondary | ICD-10-CM

## 2021-07-14 NOTE — Progress Notes (Signed)
Here's a CT you ordered on someone that had latarjet procedure not by you

## 2021-07-23 ENCOUNTER — Other Ambulatory Visit: Payer: Self-pay

## 2021-07-23 ENCOUNTER — Ambulatory Visit (INDEPENDENT_AMBULATORY_CARE_PROVIDER_SITE_OTHER): Payer: 59 | Admitting: Orthopedic Surgery

## 2021-07-23 ENCOUNTER — Encounter: Payer: Self-pay | Admitting: Orthopedic Surgery

## 2021-07-23 DIAGNOSIS — M19012 Primary osteoarthritis, left shoulder: Secondary | ICD-10-CM
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
## 2021-08-01 IMAGING — DX DG SHOULDER 1V*L*
1 series · 1 of 1 positions shown · non-contrast
Comparison: Intraoperative fluoroscopy 04/09/2020

CLINICAL DATA: Post left shoulder surgery

EXAM:
LEFT SHOULDER

[shoulder ap]
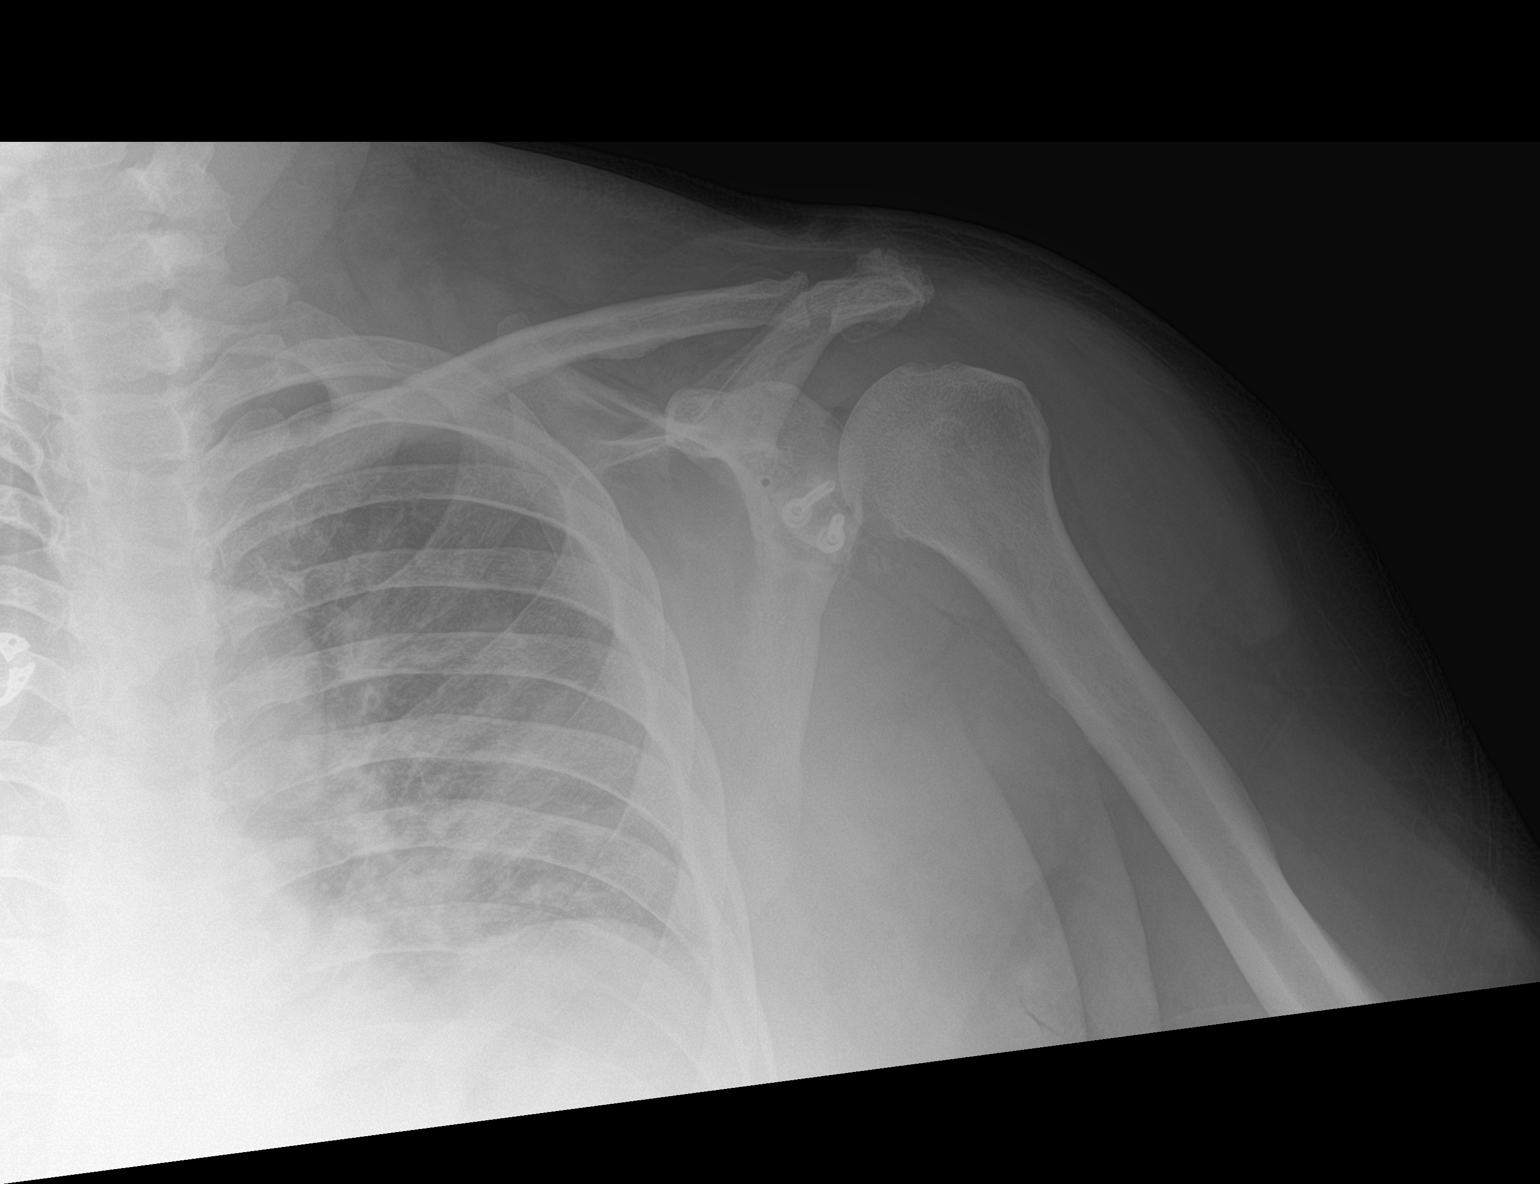

[1 of 1 positions shown; findings below may reference images not displayed]

FINDINGS: Postoperative radiographs demonstrate placement short cannulated
screws along the inferior glenoid compatible with a coracoid process
transfer/Latarjet procedure for reconstruction post bony Bankart
type injury seen on comparison CT. Expected postsurgical soft tissue
changes including effusion and intra-articular gas are noted.
Included portions of the left chest wall are unremarkable.
Background of mild acromioclavicular and glenohumeral arthrosis.
IMPRESSION: Findings compatible with coracoid process transfer/Laterjet
procedure post bony Bankart type injury. No acute complication.

## 2021-08-06 ENCOUNTER — Telehealth: Payer: 59 | Admitting: Orthopedic Surgery

## 2021-08-06 ENCOUNTER — Telehealth: Payer: Self-pay | Admitting: Orthopedic Surgery

## 2021-08-06 NOTE — Telephone Encounter (Signed)
See other note. Duplicate message

## 2021-08-06 NOTE — Telephone Encounter (Signed)
Pt called stating he's having surgery on 09/16/21. However he works 3rd shift so he will need a work note excusing him from 09/15/21. Pt would like to have this left at the front desk and would like a CB when it's ready.   [phone]

## 2021-08-06 NOTE — Telephone Encounter (Signed)
Yeah okay for this. Plan on 2-3 months  out of work depending on how rehab goes afterward

## 2021-08-06 NOTE — Telephone Encounter (Signed)
Patient called advised the first day he is going to out of work is 09-14-2021. Patient said he  will come pick up the note when it is ready.   Please see previous note from the patient. The number to contact patient is [phone]

## 2021-08-24 ENCOUNTER — Telehealth: Payer: Self-pay | Admitting: Orthopedic Surgery

## 2021-08-24 NOTE — Telephone Encounter (Signed)
I received $25.00 cash,medical records release form and disability paperwork from patient/Forwarding to [employer] today 

## 2021-08-27 ENCOUNTER — Other Ambulatory Visit: Payer: Self-pay

## 2021-08-28 NOTE — Pre-Procedure Instructions (Signed)
Surgical Instructions   Your procedure is scheduled on Tuesday, September 20th. Report to [facility] Main Entrance "A" at 09:00 A.M., then check in with the Admitting office. Call this number if you have problems the morning of surgery: [phone]   If you have any questions prior to your surgery date call [phone]: Open Monday-Friday 8am-4pm   Remember: Do not eat after midnight the night before your surgery  You may drink clear liquids until 08:00 AM the morning of your surgery.   Clear liquids allowed are: Water, Non-Citrus Juices (without pulp), Carbonated Beverages, Clear Tea, Black Coffee ONLY (NO MILK, CREAM OR POWDERED CREAMER of any kind), and Gatorade   Take these medicines the morning of surgery with A SIP OF WATER  gabapentin (NEURONTIN) methocarbamol (ROBAXIN)   If needed: acetaminophen (TYLENOL)   As of today, STOP taking any Aspirin (unless otherwise instructed by your surgeon) Aleve, Naproxen, Ibuprofen, Motrin, Advil, Goody's, BC's, all herbal medications, fish oil, and all vitamins.          Do not wear jewelry  Do not wear lotions, powders, colognes, or deodorant. Do not shave 48 hours prior to surgery.   Do not bring valuables to the hospital. [facility] is not responsible for any belongings or valuables.               Do NOT Smoke (Tobacco/Vaping)  24 hours prior to your procedure If you use a CPAP at night, you may bring all equipment for your overnight stay.   Contacts, glasses, dentures or bridgework may not be worn into surgery, please bring cases for these belongings   For patients admitted to the hospital, discharge time will be determined by your treatment team.   Patients discharged the day of surgery will not be allowed to drive home, and someone needs to stay with them for 24 hours.  ONLY 1 SUPPORT PERSON MAY BE PRESENT WHILE YOU ARE IN SURGERY. IF YOU ARE TO BE ADMITTED ONCE YOU ARE IN YOUR ROOM YOU WILL BE ALLOWED TWO (2) VISITORS.   Minor children may have two parents present. Special consideration for safety and communication needs will be reviewed on a case by case basis.  Special instructions:    Oral Hygiene is also important to reduce your risk of infection.  Remember - BRUSH YOUR TEETH THE MORNING OF SURGERY WITH YOUR REGULAR TOOTHPASTE   [facility]- Preparing For Surgery  Before surgery, you can play an important role. Because skin is not sterile, your skin needs to be as free of germs as possible. You can reduce the number of germs on your skin by washing with CHG (chlorahexidine gluconate) Soap before surgery.  CHG is an antiseptic cleaner which kills germs and bonds with the skin to continue killing germs even after washing.     Please do not use if you have an allergy to CHG or antibacterial soaps. If your skin becomes reddened/irritated stop using the CHG.  Do not shave (including legs and underarms) for at least 48 hours prior to first CHG shower. It is OK to shave your face.  Please follow these instructions carefully.     Shower the NIGHT BEFORE SURGERY and the MORNING OF SURGERY with CHG Soap.   If you chose to wash your hair, wash your hair first as usual with your normal shampoo. After you shampoo, rinse your hair and body thoroughly to remove the shampoo.  Then [employer] and genitals (private parts) with your normal soap and  rinse thoroughly to remove soap.  After that Use CHG Soap as you would any other liquid soap. You can apply CHG directly to the skin and wash gently with a scrungie or a clean washcloth.   Apply the CHG Soap to your body ONLY FROM THE NECK DOWN.  Do not use on open wounds or open sores. Avoid contact with your eyes, ears, mouth and genitals (private parts). Wash Face and genitals (private parts)  with your normal soap.   Wash thoroughly, paying special attention to the area where your surgery will be performed.  Thoroughly rinse your body with warm water from the neck  down.  DO NOT shower/wash with your normal soap after using and rinsing off the CHG Soap.  Pat yourself dry with a CLEAN TOWEL.  Wear CLEAN PAJAMAS to bed the night before surgery  Place CLEAN SHEETS on your bed the night before your surgery  DO NOT SLEEP WITH PETS.   Day of Surgery:  Take a shower with CHG soap. Wear Clean/Comfortable clothing the morning of surgery Do not apply any deodorants/lotions.   Remember to brush your teeth WITH YOUR REGULAR TOOTHPASTE.   Please read over the following fact sheets that you were given.

## 2021-09-01 ENCOUNTER — Encounter (HOSPITAL_COMMUNITY): Payer: Self-pay

## 2021-09-01 ENCOUNTER — Other Ambulatory Visit: Payer: Self-pay

## 2021-09-01 ENCOUNTER — Encounter (HOSPITAL_COMMUNITY)
Admission: RE | Admit: 2021-09-01 | Discharge: 2021-09-01 | Disposition: A | Discharge status: Home / Self Care | Payer: 59 | Source: Ambulatory Visit | Attending: Orthopedic Surgery | Admitting: Orthopedic Surgery

## 2021-09-01 DIAGNOSIS — Z01812 Encounter for preprocedural laboratory examination: Secondary | ICD-10-CM | POA: Diagnosis present

## 2021-09-01 HISTORY — DX: Unspecified osteoarthritis, unspecified site: M19.90

## 2021-09-01 LAB — COMPREHENSIVE METABOLIC PANEL
ALT: 11 U/L (ref 0–44)
AST: 12 U/L — ABNORMAL LOW (ref 15–41)
Albumin: 3.5 g/dL (ref 3.5–5.0)
Alkaline Phosphatase: 48 U/L (ref 38–126)
Anion gap: 6 (ref 5–15)
BUN: 12 mg/dL (ref 6–20)
CO2: 24 mmol/L (ref 22–32)
Calcium: 9.1 mg/dL (ref 8.9–10.3)
Chloride: 109 mmol/L (ref 98–111)
Creatinine, Ser: 0.94 mg/dL (ref 0.61–1.24)
GFR, Estimated: >60 mL/min (ref >60)
Glucose, Bld: 132 mg/dL — ABNORMAL HIGH (ref 70–99)
Potassium: 3.9 mmol/L (ref 3.5–5.1)
Sodium: 139 mmol/L (ref 135–145)
Total Bilirubin: 0.6 mg/dL (ref 0.3–1.2)
Total Protein: 6.6 g/dL (ref 6.5–8.1)

## 2021-09-01 LAB — CBC
HCT: 44.6 % (ref 39.0–52.0)
Hemoglobin: 14 g/dL (ref 13.0–17.0)
MCH: 29.7 pg (ref 26.0–34.0)
MCHC: 31.4 g/dL (ref 30.0–36.0)
MCV: 94.7 fL (ref 80.0–100.0)
Platelets: 337 10*3/uL (ref 150–400)
RBC: 4.71 MIL/uL (ref 4.22–5.81)
RDW: 11.7 % (ref 11.5–15.5)
WBC: 6.1 10*3/uL (ref 4.0–10.5)
nRBC: 0 % (ref 0.0–0.2)

## 2021-09-01 NOTE — Pre-Procedure Instructions (Addendum)
Surgical Instructions   Your procedure is scheduled on Tuesday, September 20th. Report to [facility] Main Entrance "A" at 9:00 A.M., then check in with the Admitting office. Call this number if you have problems the morning of surgery: [phone]   If you have any questions prior to your surgery date call [phone]: Open Monday-Friday 8am-4pm   Remember: Do not eat after midnight the night before your surgery  You may drink clear liquids until 8:00 AM the morning of your surgery.   Clear liquids allowed are: Water, Non-Citrus Juices (without pulp), Carbonated Beverages, Clear Tea, Black Coffee ONLY (NO MILK, CREAM OR POWDERED CREAMER of any kind), and Gatorade.  Enhanced Recovery after Surgery for Orthopedics Enhanced Recovery after Surgery is a protocol used to improve the stress on your body and your recovery after surgery.  Patient Instructions  The day of surgery (if you do NOT have diabetes):  Drink ONE (1) Pre-Surgery Clear Ensure by 8:00 am the morning of surgery   This drink was given to you during your hospital  pre-op appointment visit. Nothing else to drink after completing the  Pre-Surgery Clear Ensure.         If you have questions, please contact your surgeon's office.    Take these medicines the morning of surgery with A SIP OF WATER   If needed: acetaminophen (TYLENOL)  As of 9/13, STOP taking any Aspirin (unless otherwise instructed by your surgeon) Aleve, Naproxen, Ibuprofen, Motrin, Advil, Goody's, BC's, all herbal medications, fish oil, and all vitamins.          Do not wear jewelry  Do not wear lotions, powders, colognes, or deodorant. Do not shave 48 hours prior to surgery.   Do not bring valuables to the hospital. [facility] is not responsible for any belongings or valuables.  Do NOT Smoke (Tobacco/Vaping)  24 hours prior to your procedure If you use a CPAP at night, you may bring all equipment for your overnight stay.   Contacts, glasses,  dentures or bridgework may not be worn into surgery, please bring cases for these belongings   For patients admitted to the hospital, discharge time will be determined by your treatment team.   Patients discharged the day of surgery will not be allowed to drive home, and someone needs to stay with them for 24 hours.  ONLY 1 SUPPORT PERSON MAY BE PRESENT WHILE YOU ARE IN SURGERY. IF YOU ARE TO BE ADMITTED ONCE YOU ARE IN YOUR ROOM YOU WILL BE ALLOWED TWO (2) VISITORS.  Minor children may have two parents present. Special consideration for safety and communication needs will be reviewed on a case by case basis.  Special instructions:    Oral Hygiene is also important to reduce your risk of infection.  Remember - BRUSH YOUR TEETH THE MORNING OF SURGERY WITH YOUR REGULAR TOOTHPASTE   - Preparing For Surgery  Before surgery, you can play an important role. Because skin is not sterile, your skin needs to be as free of germs as possible. You can reduce the number of germs on your skin by washing with CHG (chlorahexidine gluconate) Soap before surgery.  CHG is an antiseptic cleaner which kills germs and bonds with the skin to continue killing germs even after washing.     Please do not use if you have an allergy to CHG or antibacterial soaps. If your skin becomes reddened/irritated stop using the CHG.  Do not shave (including legs and underarms) for at least 48 hours  prior to first CHG shower. It is OK to shave your face.  Please follow these instructions carefully.     Shower the NIGHT BEFORE SURGERY and the MORNING OF SURGERY with CHG Soap.   If you chose to wash your hair, wash your hair first as usual with your normal shampoo. After you shampoo, rinse your hair and body thoroughly to remove the shampoo.  Then [employer] and genitals (private parts) with your normal soap and rinse thoroughly to remove soap.  After that Use CHG Soap as you would any other liquid soap. You can apply CHG  directly to the skin and wash gently with a scrungie or a clean washcloth.   Apply the CHG Soap to your body ONLY FROM THE NECK DOWN.  Do not use on open wounds or open sores. Avoid contact with your eyes, ears, mouth and genitals (private parts). Wash Face and genitals (private parts)  with your normal soap.   Wash thoroughly, paying special attention to the area where your surgery will be performed.  Thoroughly rinse your body with warm water from the neck down.  DO NOT shower/wash with your normal soap after using and rinsing off the CHG Soap.  Pat yourself dry with a CLEAN TOWEL.  Wear CLEAN PAJAMAS to bed the night before surgery  Place CLEAN SHEETS on your bed the night before your surgery  DO NOT SLEEP WITH PETS.   Day of Surgery:  Take a shower with CHG soap. Wear Clean/Comfortable clothing the morning of surgery Do not apply any deodorants/lotions.   Remember to brush your teeth WITH YOUR REGULAR TOOTHPASTE.   Please read over the following fact sheets that you were given.

## 2021-09-01 NOTE — Progress Notes (Signed)
PCP - denies Cardiologist - denies   Chest x-ray - n/a EKG - n/a Stress Test - denies ECHO - denies Cardiac Cath - denies  Sleep Study - denies CPAP - denies  Blood Thinner Instructions: n/a Aspirin Instructions:n/a  ERAS Protcol - Clears until 0800 DOS PRE-SURGERY Ensure or G2- Pt given (1) Pre-surgical Ensure  COVID TEST- Not indicated; ambulatory surgery.  Anesthesia review: No  Patient denies shortness of breath, fever, cough and chest pain at PAT appointment   All instructions explained to the patient, with a verbal understanding of the material. Patient agrees to go over the instructions while at home for a better understanding. Patient also instructed to self quarantine after being tested for COVID-19. The opportunity to ask questions was provided.

## 2021-09-15 ENCOUNTER — Ambulatory Visit (HOSPITAL_COMMUNITY): Payer: 59 | Admitting: Certified Registered"

## 2021-09-15 ENCOUNTER — Ambulatory Visit (HOSPITAL_COMMUNITY)
Admission: RE | Admit: 2021-09-15 | Discharge: 2021-09-15 | Disposition: A | Discharge status: Home / Self Care | Payer: 59 | Attending: Orthopedic Surgery | Admitting: Orthopedic Surgery

## 2021-09-15 ENCOUNTER — Other Ambulatory Visit: Payer: Self-pay

## 2021-09-15 ENCOUNTER — Other Ambulatory Visit (HOSPITAL_COMMUNITY): Payer: Self-pay

## 2021-09-15 ENCOUNTER — Encounter (HOSPITAL_COMMUNITY)
Admission: RE | Disposition: A | Discharge status: Home / Self Care | Payer: Self-pay | Source: Home / Self Care | Attending: Orthopedic Surgery

## 2021-09-15 ENCOUNTER — Encounter (HOSPITAL_COMMUNITY): Payer: Self-pay | Admitting: Orthopedic Surgery

## 2021-09-15 DIAGNOSIS — X58XXXA Exposure to other specified factors, initial encounter: Secondary | ICD-10-CM | POA: Diagnosis not present

## 2021-09-15 DIAGNOSIS — F17210 Nicotine dependence, cigarettes, uncomplicated: Secondary | ICD-10-CM | POA: Insufficient documentation

## 2021-09-15 DIAGNOSIS — M19012 Primary osteoarthritis, left shoulder: Secondary | ICD-10-CM

## 2021-09-15 DIAGNOSIS — T84038A Mechanical loosening of other internal prosthetic joint, initial encounter: Secondary | ICD-10-CM | POA: Insufficient documentation

## 2021-09-15 DIAGNOSIS — E119 Type 2 diabetes mellitus without complications: Secondary | ICD-10-CM | POA: Insufficient documentation

## 2021-09-15 DIAGNOSIS — T8484XA Pain due to internal orthopedic prosthetic devices, implants and grafts, initial encounter: Secondary | ICD-10-CM

## 2021-09-15 DIAGNOSIS — M12812 Other specific arthropathies, not elsewhere classified, left shoulder: Secondary | ICD-10-CM

## 2021-09-15 DIAGNOSIS — M65912 Unspecified synovitis and tenosynovitis, left shoulder: Secondary | ICD-10-CM

## 2021-09-15 DIAGNOSIS — M65812 Other synovitis and tenosynovitis, left shoulder: Secondary | ICD-10-CM | POA: Diagnosis not present

## 2021-09-15 DIAGNOSIS — M24012 Loose body in left shoulder: Secondary | ICD-10-CM | POA: Diagnosis not present

## 2021-09-15 DIAGNOSIS — Z79899 Other long term (current) drug therapy: Secondary | ICD-10-CM | POA: Diagnosis not present

## 2021-09-15 HISTORY — PX: SHOULDER ARTHROSCOPY: SHX128

## 2021-09-15 HISTORY — PX: HARDWARE REMOVAL: SHX979

## 2021-09-15 SURGERY — ARTHROSCOPY, SHOULDER
Anesthesia: General | Site: Shoulder | Laterality: Left

## 2021-09-15 MED ORDER — LACTATED RINGERS IV SOLN: INTRAVENOUS | Status: DC

## 2021-09-15 MED ORDER — POVIDONE-IODINE 10 % EX SWAB
2.0000 | Freq: Once | CUTANEOUS | Status: AC
Start: 2021-09-15 — End: 2021-09-15
  Administered 2021-09-15: 2 via TOPICAL

## 2021-09-15 MED ORDER — PROPOFOL 1000 MG/100ML IV EMUL
INTRAVENOUS | Status: AC
  Filled 2021-09-15: qty 100

## 2021-09-15 MED ORDER — CELECOXIB 100 MG PO CAPS
100.0000 mg | ORAL_CAPSULE | Freq: Two times a day (BID) | ORAL | 0 refills | Status: AC
  Filled 2021-09-15: qty 60, 30d supply, fill #0

## 2021-09-15 MED ORDER — BUPIVACAINE-EPINEPHRINE (PF) 0.5% -1:200000 IJ SOLN
INTRAMUSCULAR | Status: DC | PRN
  Administered 2021-09-15: 15 mL via PERINEURAL

## 2021-09-15 MED ORDER — EPINEPHRINE PF 1 MG/ML IJ SOLN
INTRAMUSCULAR | Status: DC | PRN
  Administered 2021-09-15: 2 mg

## 2021-09-15 MED ORDER — PROPOFOL 10 MG/ML IV BOLUS
INTRAVENOUS | Status: AC
  Filled 2021-09-15: qty 20

## 2021-09-15 MED ORDER — FENTANYL CITRATE (PF) 100 MCG/2ML IJ SOLN: 100.0000 ug | Freq: Once | INTRAMUSCULAR | Status: AC

## 2021-09-15 MED ORDER — ORAL CARE MOUTH RINSE: 15.0000 mL | Freq: Once | OROMUCOSAL | Status: AC

## 2021-09-15 MED ORDER — FENTANYL CITRATE (PF) 250 MCG/5ML IJ SOLN
INTRAMUSCULAR | Status: DC | PRN
  Administered 2021-09-15 (×4): 50 ug via INTRAVENOUS

## 2021-09-15 MED ORDER — FENTANYL CITRATE (PF) 100 MCG/2ML IJ SOLN: 25.0000 ug | INTRAMUSCULAR | Status: DC | PRN

## 2021-09-15 MED ORDER — FENTANYL CITRATE (PF) 250 MCG/5ML IJ SOLN
INTRAMUSCULAR | Status: AC
  Filled 2021-09-15: qty 5

## 2021-09-15 MED ORDER — ALBUTEROL SULFATE HFA 108 (90 BASE) MCG/ACT IN AERS
INHALATION_SPRAY | RESPIRATORY_TRACT | Status: DC | PRN
  Administered 2021-09-15: 3 via RESPIRATORY_TRACT

## 2021-09-15 MED ORDER — CEFAZOLIN SODIUM-DEXTROSE 2-4 GM/100ML-% IV SOLN
2.0000 g | INTRAVENOUS | Status: AC
  Administered 2021-09-15: 2 g via INTRAVENOUS
  Filled 2021-09-15: qty 100

## 2021-09-15 MED ORDER — OXYCODONE HCL 5 MG/5ML PO SOLN: 5.0000 mg | Freq: Once | ORAL | Status: DC | PRN

## 2021-09-15 MED ORDER — BUPIVACAINE LIPOSOME 1.3 % IJ SUSP
INTRAMUSCULAR | Status: DC | PRN
  Administered 2021-09-15: 10 mL via PERINEURAL

## 2021-09-15 MED ORDER — ONDANSETRON HCL 4 MG/2ML IJ SOLN: 4.0000 mg | Freq: Four times a day (QID) | INTRAMUSCULAR | Status: DC | PRN

## 2021-09-15 MED ORDER — FENTANYL CITRATE (PF) 100 MCG/2ML IJ SOLN
INTRAMUSCULAR | Status: AC
  Administered 2021-09-15: 100 ug via INTRAVENOUS
  Filled 2021-09-15: qty 2

## 2021-09-15 MED ORDER — CHLORHEXIDINE GLUCONATE 0.12 % MT SOLN
15.0000 mL | Freq: Once | OROMUCOSAL | Status: AC
  Administered 2021-09-15: 15 mL via OROMUCOSAL
  Filled 2021-09-15: qty 15

## 2021-09-15 MED ORDER — POVIDONE-IODINE 7.5 % EX SOLN
Freq: Once | CUTANEOUS | Status: DC
  Filled 2021-09-15: qty 118

## 2021-09-15 MED ORDER — SODIUM CHLORIDE 0.9 % IR SOLN
Status: DC | PRN
  Administered 2021-09-15: 6000 mL

## 2021-09-15 MED ORDER — MIDAZOLAM HCL 2 MG/2ML IJ SOLN
INTRAMUSCULAR | Status: DC | PRN
  Administered 2021-09-15: 2 mg via INTRAVENOUS

## 2021-09-15 MED ORDER — PHENYLEPHRINE HCL-NACL 20-0.9 MG/250ML-% IV SOLN
INTRAVENOUS | Status: DC | PRN
  Administered 2021-09-15: 25 ug/min via INTRAVENOUS

## 2021-09-15 MED ORDER — GABAPENTIN 300 MG PO CAPS
300.0000 mg | ORAL_CAPSULE | Freq: Three times a day (TID) | ORAL | 0 refills | Status: DC
  Filled 2021-09-15: qty 30, 10d supply, fill #0

## 2021-09-15 MED ORDER — GLYCOPYRROLATE 0.2 MG/ML IJ SOLN
INTRAMUSCULAR | Status: DC | PRN
  Administered 2021-09-15: .2 mg via INTRAVENOUS

## 2021-09-15 MED ORDER — MIDAZOLAM HCL 2 MG/2ML IJ SOLN
INTRAMUSCULAR | Status: AC
  Filled 2021-09-15: qty 2

## 2021-09-15 MED ORDER — ROCURONIUM BROMIDE 10 MG/ML (PF) SYRINGE
PREFILLED_SYRINGE | INTRAVENOUS | Status: DC | PRN
  Administered 2021-09-15: 50 mg via INTRAVENOUS
  Administered 2021-09-15: 20 mg via INTRAVENOUS

## 2021-09-15 MED ORDER — MIDAZOLAM HCL 2 MG/2ML IJ SOLN
INTRAMUSCULAR | Status: AC
  Administered 2021-09-15: 2 mg via INTRAVENOUS
  Filled 2021-09-15: qty 2

## 2021-09-15 MED ORDER — ONDANSETRON HCL 4 MG/2ML IJ SOLN
INTRAMUSCULAR | Status: DC | PRN
  Administered 2021-09-15: 4 mg via INTRAVENOUS

## 2021-09-15 MED ORDER — KETOROLAC TROMETHAMINE 30 MG/ML IJ SOLN
INTRAMUSCULAR | Status: DC | PRN
  Administered 2021-09-15: 30 mg via INTRAVENOUS

## 2021-09-15 MED ORDER — EPINEPHRINE PF 1 MG/ML IJ SOLN
INTRAMUSCULAR | Status: AC
  Filled 2021-09-15: qty 2

## 2021-09-15 MED ORDER — MIDAZOLAM HCL 2 MG/2ML IJ SOLN: 2.0000 mg | Freq: Once | INTRAMUSCULAR | Status: AC

## 2021-09-15 MED ORDER — DEXAMETHASONE SODIUM PHOSPHATE 10 MG/ML IJ SOLN
INTRAMUSCULAR | Status: AC
  Filled 2021-09-15: qty 2

## 2021-09-15 MED ORDER — SUGAMMADEX SODIUM 200 MG/2ML IV SOLN
INTRAVENOUS | Status: DC | PRN
  Administered 2021-09-15: 300 mg via INTRAVENOUS

## 2021-09-15 MED ORDER — TRANEXAMIC ACID-NACL 1000-0.7 MG/100ML-% IV SOLN
1000.0000 mg | INTRAVENOUS | Status: AC
  Administered 2021-09-15: 1000 mg via INTRAVENOUS
  Filled 2021-09-15: qty 100

## 2021-09-15 MED ORDER — DEXAMETHASONE SODIUM PHOSPHATE 10 MG/ML IJ SOLN
INTRAMUSCULAR | Status: DC | PRN
  Administered 2021-09-15: 4 mg via INTRAVENOUS

## 2021-09-15 MED ORDER — OXYCODONE HCL 5 MG PO TABS
5.0000 mg | ORAL_TABLET | ORAL | 0 refills | Status: AC | PRN
  Filled 2021-09-15: qty 30, 5d supply, fill #0

## 2021-09-15 MED ORDER — PROPOFOL 10 MG/ML IV BOLUS
INTRAVENOUS | Status: DC | PRN
  Administered 2021-09-15: 250 mg via INTRAVENOUS

## 2021-09-15 MED ORDER — METHOCARBAMOL 500 MG PO TABS
500.0000 mg | ORAL_TABLET | Freq: Three times a day (TID) | ORAL | 0 refills | Status: DC | PRN
  Filled 2021-09-15: qty 30, 10d supply, fill #0

## 2021-09-15 MED ORDER — OXYCODONE HCL 5 MG PO TABS: 5.0000 mg | ORAL_TABLET | Freq: Once | ORAL | Status: DC | PRN

## 2021-09-15 SURGICAL SUPPLY — 58 items
ALCOHOL 70% 16 OZ (MISCELLANEOUS) ×2 IMPLANT
BAG COUNTER SPONGE SURGICOUNT (BAG) ×2 IMPLANT
BLADE EXCALIBUR 4.0X13 (MISCELLANEOUS) ×2 IMPLANT
BLADE SURG 11 STRL SS (BLADE) ×2 IMPLANT
BNDG GAUZE ELAST 4 BULKY (GAUZE/BANDAGES/DRESSINGS) ×2 IMPLANT
CANNULA 5.75X71 LONG (CANNULA) ×2 IMPLANT
CANNULA TWIST IN 6X9CM RED TRO (CANNULA) ×2 IMPLANT
CANNULA TWIST IN 8.25X7CM (CANNULA) ×2 IMPLANT
COVER SURGICAL LIGHT HANDLE (MISCELLANEOUS) ×2 IMPLANT
DRAPE HALF SHEET 40X57 (DRAPES) ×2 IMPLANT
DRAPE IMP U-DRAPE 54X76 (DRAPES) ×2 IMPLANT
DRAPE INCISE IOBAN 66X45 STRL (DRAPES) ×2 IMPLANT
DRAPE STERI 35X30 U-POUCH (DRAPES) ×2 IMPLANT
DRAPE U-SHAPE 47X51 STRL (DRAPES) ×4 IMPLANT
DRSG PAD ABDOMINAL 8X10 ST (GAUZE/BANDAGES/DRESSINGS) IMPLANT
DRSG TEGADERM 4X4.75 (GAUZE/BANDAGES/DRESSINGS) ×6 IMPLANT
DURAPREP 26ML APPLICATOR (WOUND CARE) ×4 IMPLANT
DW OUTFLOW CASSETTE/TUBE SET (MISCELLANEOUS) ×2 IMPLANT
ELECT REM PT RETURN 9FT ADLT (ELECTROSURGICAL) ×2
ELECTRODE REM PT RTRN 9FT ADLT (ELECTROSURGICAL) ×1 IMPLANT
GAUZE SPONGE 4X4 12PLY STRL (GAUZE/BANDAGES/DRESSINGS) ×4 IMPLANT
GAUZE XEROFORM 1X8 LF (GAUZE/BANDAGES/DRESSINGS) ×2 IMPLANT
GLOVE SRG 8 PF TXTR STRL LF DI (GLOVE) ×1 IMPLANT
GLOVE SURG ENC TEXT LTX SZ6.5 (GLOVE) ×2 IMPLANT
GLOVE SURG LTX SZ8 (GLOVE) ×2 IMPLANT
GLOVE SURG UNDER POLY LF SZ6.5 (GLOVE) ×2 IMPLANT
GLOVE SURG UNDER POLY LF SZ8 (GLOVE) ×2
GOWN STRL REUS W/ TWL LRG LVL3 (GOWN DISPOSABLE) ×3 IMPLANT
GOWN STRL REUS W/TWL LRG LVL3 (GOWN DISPOSABLE) ×6
KIT BASIN OR (CUSTOM PROCEDURE TRAY) ×2 IMPLANT
KIT TURNOVER KIT B (KITS) ×2 IMPLANT
MANIFOLD NEPTUNE II (INSTRUMENTS) ×2 IMPLANT
NEEDLE HYPO 25X1 1.5 SAFETY (NEEDLE) ×2 IMPLANT
NEEDLE SPNL 18GX3.5 QUINCKE PK (NEEDLE) ×2 IMPLANT
NS IRRIG 1000ML POUR BTL (IV SOLUTION) ×2 IMPLANT
PACK GENERAL/GYN (CUSTOM PROCEDURE TRAY) ×2 IMPLANT
PACK SHOULDER (CUSTOM PROCEDURE TRAY) ×2 IMPLANT
PAD ARMBOARD 7.5X6 YLW CONV (MISCELLANEOUS) ×4 IMPLANT
PAD CAST 4YDX4 CTTN HI CHSV (CAST SUPPLIES) IMPLANT
PADDING CAST COTTON 4X4 STRL (CAST SUPPLIES)
PORT APPOLLO RF 90DEGREE MULTI (SURGICAL WAND) ×4 IMPLANT
PORTAL SKID DEVICE (INSTRUMENTS) ×2 IMPLANT
RESTRAINT HEAD UNIVERSAL NS (MISCELLANEOUS) ×2 IMPLANT
SLING ARM FOAM STRAP LRG (SOFTGOODS) ×2 IMPLANT
SPONGE T-LAP 4X18 ~~LOC~~+RFID (SPONGE) ×4 IMPLANT
STAPLER VISISTAT 35W (STAPLE) ×2 IMPLANT
STOCKINETTE IMPERVIOUS 9X36 MD (GAUZE/BANDAGES/DRESSINGS) ×2 IMPLANT
SUCTION FRAZIER HANDLE 10FR (MISCELLANEOUS) ×2
SUCTION TUBE FRAZIER 10FR DISP (MISCELLANEOUS) ×1 IMPLANT
SUT ETHILON 3 0 PS 1 (SUTURE) ×2 IMPLANT
SUT VIC AB 0 CT1 27 (SUTURE) ×2
SUT VIC AB 0 CT1 27XBRD ANBCTR (SUTURE) ×1 IMPLANT
SUT VIC AB 2-0 CT1 27 (SUTURE) ×2
SUT VIC AB 2-0 CT1 TAPERPNT 27 (SUTURE) ×1 IMPLANT
TOWEL GREEN STERILE (TOWEL DISPOSABLE) ×2 IMPLANT
TOWEL GREEN STERILE FF (TOWEL DISPOSABLE) ×2 IMPLANT
TUBING ARTHROSCOPY IRRIG 16FT (MISCELLANEOUS) ×2 IMPLANT
WATER STERILE IRR 1000ML POUR (IV SOLUTION) ×2 IMPLANT

## 2021-09-15 NOTE — Anesthesia Procedure Notes (Signed)
Procedure Name: Intubation Date/Time: 09/15/2021 11:39 AM Performed by: Stanton Kidney, CRNA Pre-anesthesia Checklist: Patient identified, Patient being monitored, Timeout performed, Emergency Drugs available and Suction available Patient Re-evaluated:Patient Re-evaluated prior to induction Oxygen Delivery Method: Circle system utilized Preoxygenation: Pre-oxygenation with 100% oxygen Induction Type: IV induction Ventilation: Mask ventilation without difficulty Laryngoscope Size: 3 and Miller Grade View: Grade I Tube type: Oral Tube size: 7.5 mm Number of attempts: 1 Airway Equipment and Method: Stylet Placement Confirmation: ETT inserted through vocal cords under direct vision, positive ETCO2 and breath sounds checked- equal and bilateral Secured at: 23 cm Tube secured with: Tape Dental Injury: Teeth and Oropharynx as per pre-operative assessment

## 2021-09-15 NOTE — Transfer of Care (Signed)
Immediate Anesthesia Transfer of Care Note  Patient: Jeffrey Cox  Procedure(s) Performed: LEFT SHOULDER ARTHROSCOPY,  DEBRIDEMENT (Left: Shoulder) HARDWARE REMOVAL (Left: Shoulder)  Patient Location: PACU  Anesthesia Type:General  Level of Consciousness: awake, alert , oriented and patient cooperative  Airway & Oxygen Therapy: Patient Spontanous Breathing and Patient connected to nasal cannula oxygen  Post-op Assessment: Report given to RN and Post -op Vital signs reviewed and stable  Post vital signs: Reviewed and stable  Last Vitals:  Vitals Value Taken Time  BP 140/93 09/15/21 1350  Temp 36.2 C 09/15/21 1350  Pulse 86 09/15/21 1359  Resp 13 09/15/21 1359  SpO2 93 % 09/15/21 1359  Vitals shown include unvalidated device data.  Last Pain:  Vitals:   09/15/21 1350  TempSrc:   PainSc: 0-No pain      Patients Stated Pain Goal: 3 (09/15/21 0944)  Complications: No notable events documented.

## 2021-09-15 NOTE — Brief Op Note (Signed)
   09/15/2021  1:50 PM  PATIENT:  Jeffrey Cox  50 y.o. male  PRE-OPERATIVE DIAGNOSIS:  left shoulder arthritis, retained hardware  POST-OPERATIVE DIAGNOSIS:  left shoulder arthritis, retained hardware  PROCEDURE:  Procedure(s): LEFT SHOULDER ARTHROSCOPY,  DEBRIDEMENT HARDWARE REMOVAL deep  SURGEON:  Surgeon(s): August Saucer, Corrie Mckusick, MD  ASSISTANT: Magnant pa  ANESTHESIA:   general  EBL: 10 ml    Total I/O In: 100 [IV Piggyback:100] Out: 400 [Urine:400]  BLOOD ADMINISTERED: none  DRAINS: none   LOCAL MEDICATIONS USED:  none  SPECIMEN:  No Specimen  COUNTS:  YES  TOURNIQUET:  * No tourniquets in log *  DICTATION: .Other Dictation: Dictation Number [id number]  PLAN OF CARE: Discharge to home after PACU  PATIENT DISPOSITION:  PACU - hemodynamically stable

## 2021-09-15 NOTE — Anesthesia Procedure Notes (Signed)
Anesthesia Regional Block: Interscalene brachial plexus block   Pre-Anesthetic Checklist: , timeout performed,  Correct Patient, Correct Site, Correct Laterality,  Correct Procedure, Correct Position, site marked,  Risks and benefits discussed,  Surgical consent,  Pre-op evaluation,  At surgeon's request and post-op pain management  Laterality: Left  Prep: chloraprep       Needles:  Injection technique: Single-shot  Needle Type: Echogenic Stimulator Needle     Needle Length: 5cm  Needle Gauge: 22     Additional Needles:   Procedures:, nerve stimulator,,,,,     Nerve Stimulator or Paresthesia:  Response: biceps flexion, 0.45 mA  Additional Responses:   Narrative:  Start time: 09/15/2021 10:42 AM End time: 09/15/2021 10:52 AM Injection made incrementally with aspirations every 5 mL.  Performed by: Personally  Anesthesiologist: Achille Rich, MD  Additional Notes: Functioning IV was confirmed and monitors were applied.  A 31mm 22ga Arrow echogenic stimulator needle was used. Sterile prep and drape,hand hygiene and sterile gloves were used.  Negative aspiration and negative test dose prior to incremental administration of local anesthetic. The patient tolerated the procedure well.  Ultrasound guidance: relevent anatomy identified, needle position confirmed, local anesthetic spread visualized around nerve(s), vascular puncture avoided.  Image printed for medical record.

## 2021-09-15 NOTE — Anesthesia Preprocedure Evaluation (Signed)
Anesthesia Evaluation  Patient identified by MRN, date of birth, ID band Patient awake    Reviewed: Allergy & Precautions, H&P , NPO status , Patient's Chart, lab work & pertinent test results  Airway Mallampati: II   Neck ROM: full    Dental   Pulmonary Current Smoker,    breath sounds clear to auscultation       Cardiovascular negative cardio ROS   Rhythm:regular Rate:Normal     Neuro/Psych  Headaches, PSYCHIATRIC DISORDERS Anxiety Depression    GI/Hepatic   Endo/Other  obese  Renal/GU      Musculoskeletal  (+) Arthritis ,   Abdominal   Peds  Hematology   Anesthesia Other Findings   Reproductive/Obstetrics                             Anesthesia Physical Anesthesia Plan  ASA: 2  Anesthesia Plan: General   Post-op Pain Management:  Regional for Post-op pain   Induction: Intravenous  PONV Risk Score and Plan: 1 and Ondansetron, Dexamethasone, Midazolam and Treatment may vary due to age or medical condition  Airway Management Planned: Oral ETT  Additional Equipment:   Intra-op Plan:   Post-operative Plan: Extubation in OR  Informed Consent: I have reviewed the patients History and Physical, chart, labs and discussed the procedure including the risks, benefits and alternatives for the proposed anesthesia with the patient or authorized representative who has indicated his/her understanding and acceptance.     Dental advisory given  Plan Discussed with: CRNA, Anesthesiologist and Surgeon  Anesthesia Plan Comments:         Anesthesia Quick Evaluation
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
## 2021-09-16 ENCOUNTER — Encounter (HOSPITAL_COMMUNITY): Payer: Self-pay | Admitting: Orthopedic Surgery

## 2021-09-16 ENCOUNTER — Other Ambulatory Visit (HOSPITAL_COMMUNITY): Payer: Self-pay

## 2021-09-16 ENCOUNTER — Telehealth: Payer: Self-pay | Admitting: Orthopedic Surgery

## 2021-09-16 NOTE — Telephone Encounter (Signed)
Please see below. Patient is requesting return call from you.

## 2021-09-16 NOTE — Telephone Encounter (Signed)
Pt called asking for a CB from DR.August Saucer so he can be told what was done during his surgery on 09/15/21.

## 2021-09-16 NOTE — Telephone Encounter (Signed)
I called he is ok

## 2021-09-16 NOTE — Op Note (Signed)
NAME: Jeffrey Cox, Jeffrey Cox MEDICAL RECORD NO: [id number] ACCOUNT NO: [id number] DATE OF BIRTH: [birth year] FACILITY: [facility] LOCATION: [facility] PHYSICIAN: Graylin Shiver. 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  POSTOPERATIVE DIAGNOSES:  Left shoulder retained hardware and glenohumeral joint arthritis.  PROCEDURE:  Left shoulder arthroscopy with extensive debridement and removal of deep hardware x2.  SURGEON:  Attending, Cammy Copa, MD  ASSISTANT:  Karenann Cai, PA  INDICATIONS:  The patient is a 50 year old patient who is about a year and a half out from shoulder stabilization procedure.  Hardware is prominent and the patient has developed severe arthritis in the interim. Presents now for operative management after  explanation of risks and benefits.  CT scan does show hardware prominent within the joint and severe accelerated progression of arthritis compared to CT scan from 1 year ago.  DESCRIPTION OF PROCEDURE:  The patient was brought to the operating room where general anesthetic was induced.  Preoperative antibiotics administered.  Timeout was called.  The patient was placed in the beach chair position with the head in neutral  position.  Left arm was examined under anesthesia and found to have range of motion of about 15 degrees of external rotation, 80 degrees of abduction and 130 of forward flexion.  Head was in neutral position.  Left shoulder, arm and hand prescrubbed with  alcohol and Betadine, allowed to air dry, prepped with DuraPrep solution and draped in a sterile manner.  Collier Flowers was used to cover the entire operative field.  Timeout was called.  The posterior portal was created 2 cm medial and inferior to the  posterolateral margin of the acromion.  Diagnostic arthroscopy was performed.  The patient had end-stage glenohumeral arthritis affecting both the humeral head and  the glenoid.  Synovitis was present.  Rotator cuff fraying was present, but overall the  rotator cuff appeared intact in terms of subscap, supraspinatus and infraspinatus.  Screw heads were visible.  Arthrocare wand was used to remove soft tissue away from the screw heads.  Reversal was performed.  Following debridement and coagulation with  the Arthrocare wand of bleeding synovitis regions, a secondary portal was created with spinal needle localization.  This portal was dilated.  The portal was about 1.5 cm inferior to the initial standard anterior portal.  An 8.5 mm cannula was placed  within this initial portal.  Second portal was dilated.  Next, the skid was placed within this portal and the superior screw was removed after engaging the screw head into the screwdriver.  Following removal, attention was directed towards the more  inferior screw.  Both screws were grossly loose, but still engaged within soft tissue on the glenoid.  The soft tissue was removed from the inferior screw and with some toggle, we were able to align the screw head with the inferior portal and that screw  was also removed.  Thorough irrigation was performed.  Shoulder remained stable after hardware removal.  The shoulder joint was irrigated and the portals were closed using 2-0 nylon, 3-0 Vicryl.  An impervious dressing was placed.  The patient tolerated  the procedure well without immediate complication.  Luke's assistance was required for limb positioning, opening, closing and mobilization of the arm as well as camera management.  His assistance was a medical necessity.   SHW D: 09/15/2021 1:57:23 pm T: 09/15/2021 10:06:00 pm  JOB: [id number]/ [id number]

## 2021-09-16 NOTE — Anesthesia Postprocedure Evaluation (Signed)
Anesthesia Post Note  Patient: Jeffrey Cox  Procedure(s) Performed: LEFT SHOULDER ARTHROSCOPY,  DEBRIDEMENT (Left: Shoulder) HARDWARE REMOVAL (Left: Shoulder)     Patient location during evaluation: PACU Anesthesia Type: General Level of consciousness: awake and alert Pain management: pain level controlled Vital Signs Assessment: post-procedure vital signs reviewed and stable Respiratory status: spontaneous breathing, nonlabored ventilation, respiratory function stable and patient connected to nasal cannula oxygen Cardiovascular status: blood pressure returned to baseline and stable Postop Assessment: no apparent nausea or vomiting Anesthetic complications: no   No notable events documented.  Last Vitals:  Vitals:   09/15/21 1405 09/15/21 1420  BP: (!) 123/93 (!) 120/98  Pulse: 82 96  Resp: 16 15  Temp:  (!) 36.3 C  SpO2: 95% 98%    Last Pain:  Vitals:   09/15/21 1420  TempSrc:   PainSc: 0-No pain                 Shafter Jupin S

## 2021-09-17 NOTE — Telephone Encounter (Signed)
noted 

## 2021-09-19 DIAGNOSIS — M65912 Unspecified synovitis and tenosynovitis, left shoulder: Secondary | ICD-10-CM

## 2021-09-19 DIAGNOSIS — M65812 Other synovitis and tenosynovitis, left shoulder: Secondary | ICD-10-CM

## 2021-09-19 DIAGNOSIS — M24012 Loose body in left shoulder: Secondary | ICD-10-CM

## 2021-09-21 ENCOUNTER — Telehealth: Payer: Self-pay | Admitting: Orthopedic Surgery

## 2021-09-21 NOTE — Telephone Encounter (Signed)
IC s/w patient and advised. Verbalized understanding.  

## 2021-09-21 NOTE — Telephone Encounter (Signed)
He can try taking 2 of the oxycodone tablets every 4-6 hours but I would try and refrain from doing this with every dosage.  If pain continues with this, let us know and can discuss different medication

## 2021-09-21 NOTE — Telephone Encounter (Signed)
Pt states he hasn't had barley any sleep. He would like to know if he can get a stronger dosage or stronger medication. He said it feels like someone is stabbing him.  CB [phone]

## 2021-09-21 NOTE — Telephone Encounter (Signed)
[employer] forms received. To [employer]. °

## 2021-09-23 ENCOUNTER — Other Ambulatory Visit (HOSPITAL_COMMUNITY): Payer: Self-pay

## 2021-09-23 ENCOUNTER — Other Ambulatory Visit: Payer: Self-pay

## 2021-09-23 ENCOUNTER — Ambulatory Visit (INDEPENDENT_AMBULATORY_CARE_PROVIDER_SITE_OTHER): Payer: 59 | Admitting: Orthopedic Surgery

## 2021-09-23 DIAGNOSIS — S4292XK Fracture of left shoulder girdle, part unspecified, subsequent encounter for fracture with nonunion: Secondary | ICD-10-CM

## 2021-09-23 MED ORDER — HYDROCODONE-ACETAMINOPHEN 10-325 MG PO TABS
1.0000 | ORAL_TABLET | Freq: Four times a day (QID) | ORAL | 0 refills | Status: DC | PRN
  Filled 2021-09-23: qty 40, 10d supply, fill #0

## 2021-09-24 ENCOUNTER — Encounter: Payer: Self-pay | Admitting: Orthopedic Surgery
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
## 2021-09-25 ENCOUNTER — Ambulatory Visit: Payer: 59 | Admitting: Rehabilitative and Restorative Service Providers"

## 2021-09-30 ENCOUNTER — Ambulatory Visit: Payer: 59 | Admitting: Rehabilitative and Restorative Service Providers"

## 2021-09-30 ENCOUNTER — Encounter: Payer: Self-pay | Admitting: Rehabilitative and Restorative Service Providers"

## 2021-09-30 ENCOUNTER — Other Ambulatory Visit: Payer: Self-pay

## 2021-09-30 DIAGNOSIS — G8929 Other chronic pain: Secondary | ICD-10-CM

## 2021-09-30 DIAGNOSIS — M6281 Muscle weakness (generalized): Secondary | ICD-10-CM

## 2021-09-30 DIAGNOSIS — M25512 Pain in left shoulder: Secondary | ICD-10-CM

## 2021-09-30 DIAGNOSIS — R600 Localized edema: Secondary | ICD-10-CM

## 2021-09-30 NOTE — Patient Instructions (Signed)
Access Code: [email or web address] URL: [email or web address] Date: 09/30/2021 Prepared by: Chyrel Masson  Exercises Seated Scapular Retraction - 2 x daily - 7 x weekly - 2 sets - 10 reps - 5 hold Circular Shoulder Pendulum with Table Support (Mirrored) - 2 x daily - 7 x weekly - 3 sets - 10 reps Standing Flexion Extension Shoulder Pendulum Supported with Arm Bent (Mirrored) - 2 x daily - 7 x weekly - 3 sets - 10 reps Standing Circular Shoulder Pendulum Supported with Arm Bent (Mirrored) - 2 x daily - 7 x weekly - 3 sets - 10 reps Supine Shoulder Flexion AAROM with Hands Clasped - 2 x daily - 7 x weekly - 2 sets - 10 reps - 5 hold Seated Elbow Flexion AAROM (Mirrored) - 2 x daily - 7 x weekly - 3 sets - 10 reps
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
## 2021-10-01 ENCOUNTER — Telehealth: Payer: Self-pay | Admitting: Orthopedic Surgery

## 2021-10-01 NOTE — Telephone Encounter (Signed)
I will discuss with Dr August Saucer and reach out to patient

## 2021-10-01 NOTE — Telephone Encounter (Signed)
Form faxed to [facility], scanned in chart.

## 2021-10-01 NOTE — Telephone Encounter (Signed)
Pt's mom calling in reference to FMLA paperwork he received yesterday to fill out after his appt. Pt's mom stated they have already spoken with [employer] and told them that since he is out of work they can not afford the $25 fee. They were told to call back and speak with Dr. August Saucer or his assistant and ask if there is any way around that fee. The best call back number is [phone].

## 2021-10-07 ENCOUNTER — Encounter: Payer: 59 | Admitting: Rehabilitative and Restorative Service Providers"

## 2021-10-14 ENCOUNTER — Ambulatory Visit (INDEPENDENT_AMBULATORY_CARE_PROVIDER_SITE_OTHER): Payer: Self-pay | Admitting: Orthopedic Surgery

## 2021-10-14 ENCOUNTER — Encounter: Payer: 59 | Admitting: Physical Therapy

## 2021-10-14 ENCOUNTER — Other Ambulatory Visit: Payer: Self-pay

## 2021-10-14 DIAGNOSIS — S4292XK Fracture of left shoulder girdle, part unspecified, subsequent encounter for fracture with nonunion: Secondary | ICD-10-CM

## 2021-10-14 NOTE — Telephone Encounter (Signed)
I called pt after he missed his 10:15 appointment this morning. Pt stated he was caught up with his appointment with Dr. August Saucer. Pt was advised to come up and try to get rescheduled or worked in today.  Narda Amber, PT, MPT 10/14/21 10:40 AM

## 2021-10-18 ENCOUNTER — Encounter: Payer: Self-pay | Admitting: Orthopedic Surgery
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
## 2021-10-21 ENCOUNTER — Encounter: Payer: 59 | Admitting: Rehabilitative and Restorative Service Providers"

## 2021-10-28 ENCOUNTER — Encounter: Payer: 59 | Admitting: Rehabilitative and Restorative Service Providers"

## 2021-11-04 ENCOUNTER — Encounter: Payer: 59 | Admitting: Rehabilitative and Restorative Service Providers"

## 2021-11-09 ENCOUNTER — Ambulatory Visit
Admission: RE | Admit: 2021-11-09 | Discharge: 2021-11-09 | Disposition: A | Discharge status: Home / Self Care | Payer: 59 | Source: Ambulatory Visit | Attending: Orthopedic Surgery | Admitting: Orthopedic Surgery

## 2021-11-09 DIAGNOSIS — S4292XK Fracture of left shoulder girdle, part unspecified, subsequent encounter for fracture with nonunion: Secondary | ICD-10-CM

## 2021-11-09 NOTE — Progress Notes (Signed)
Does he have f/u?

## 2021-11-11 ENCOUNTER — Encounter: Payer: 59 | Admitting: Rehabilitative and Restorative Service Providers"

## 2021-11-17 ENCOUNTER — Telehealth: Payer: Self-pay | Admitting: Orthopedic Surgery

## 2021-11-17 NOTE — Telephone Encounter (Signed)
[employer] forms received. To [employer]. °

## 2021-11-18 ENCOUNTER — Encounter: Payer: 59 | Admitting: Rehabilitative and Restorative Service Providers"

## 2021-11-26 ENCOUNTER — Telehealth: Payer: Self-pay | Admitting: Orthopedic Surgery

## 2021-11-26 NOTE — Telephone Encounter (Signed)
Pt called stating [facility] requested his records and he wants to make sure our office send them over. He would like a CB when this has been done please.   [phone]

## 2021-11-26 NOTE — Telephone Encounter (Signed)
IC, advised patient records have been faxed to [facility].

## 2021-11-27 ENCOUNTER — Ambulatory Visit (INDEPENDENT_AMBULATORY_CARE_PROVIDER_SITE_OTHER): Payer: 59 | Admitting: Orthopedic Surgery

## 2021-11-27 ENCOUNTER — Encounter: Payer: Self-pay | Admitting: Orthopedic Surgery

## 2021-11-27 ENCOUNTER — Other Ambulatory Visit: Payer: Self-pay

## 2021-11-27 DIAGNOSIS — M19012 Primary osteoarthritis, left shoulder: Secondary | ICD-10-CM
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
## 2021-12-09 ENCOUNTER — Telehealth: Payer: Self-pay | Admitting: Orthopedic Surgery

## 2021-12-09 NOTE — Telephone Encounter (Signed)
Patient called. Says he has not had surgery yet but letter states to return to work on 12/19. His call back number is [phone]

## 2021-12-11 ENCOUNTER — Telehealth: Payer: Self-pay | Admitting: Orthopedic Surgery

## 2021-12-11 NOTE — Telephone Encounter (Signed)
Oow for 8 more weeks

## 2021-12-11 NOTE — Telephone Encounter (Signed)
IC advised note done.

## 2021-12-11 NOTE — Telephone Encounter (Signed)
Faxed to [facility] per patients request

## 2021-12-15 ENCOUNTER — Telehealth: Payer: Self-pay | Admitting: Orthopedic Surgery

## 2021-12-15 NOTE — Telephone Encounter (Signed)
Pt called requesting a call back from [employer]. Pt states [facility] states they have not received the fax. Please call pt about this matter. Pt is asking for form to be re-faxed and a call for confirmation from Lauren F. Please call pt at [phone]

## 2021-12-17 NOTE — Telephone Encounter (Signed)
IC s/w patient. He request note to be refaxed to [employer]. I did this for patient this morning.

## 2021-12-22 ENCOUNTER — Telehealth: Payer: Self-pay | Admitting: Orthopedic Surgery

## 2021-12-22 NOTE — Telephone Encounter (Signed)
Not sure about this holding for Lauren.

## 2021-12-22 NOTE — Telephone Encounter (Signed)
Pt called stating Dr.Dean told him he didn't have to pay the $25 fee to have his paperwork filled out by [employer] but now his disability is being held because the forms aren't filled out. Pt would like a CB to discuss the confusion please.   [phone]

## 2021-12-23 ENCOUNTER — Encounter (HOSPITAL_COMMUNITY): Payer: Self-pay | Admitting: Emergency Medicine

## 2021-12-23 ENCOUNTER — Other Ambulatory Visit: Payer: Self-pay

## 2021-12-23 ENCOUNTER — Emergency Department (HOSPITAL_COMMUNITY)
Admission: EM | Admit: 2021-12-23 | Discharge: 2021-12-23 | Disposition: A | Discharge status: Home / Self Care | Payer: 59 | Attending: Emergency Medicine | Admitting: Emergency Medicine

## 2021-12-23 ENCOUNTER — Emergency Department (HOSPITAL_COMMUNITY): Payer: 59

## 2021-12-23 DIAGNOSIS — F17210 Nicotine dependence, cigarettes, uncomplicated: Secondary | ICD-10-CM | POA: Insufficient documentation

## 2021-12-23 DIAGNOSIS — M25561 Pain in right knee: Secondary | ICD-10-CM | POA: Insufficient documentation
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
## 2021-12-23 NOTE — ED Notes (Signed)
RN reviewed discharge instructions w/ pt. Follow up reviewed, pt had no further questions 

## 2021-12-23 NOTE — Discharge Instructions (Addendum)
You are seen in the ER today for your knee pain.  Your physical exam is reassuring.  Suspect you have a bruise on your knee with some subsequent inflammation of the joint.  He may ice the area couple times a day for 15 to 20 minutes at a time.  Elevate it when you are resting, and use Tylenol or ibuprofen as needed for your discomfort.  You may continue use the Ace wrap provided as needed and may follow-up with orthopedic provider.  Return to the ER with any new numbness, ting, or weakness in your leg.

## 2021-12-23 NOTE — ED Triage Notes (Signed)
C/o R knee pain since hitting it on a nightstand 4 days ago.

## 2021-12-24 NOTE — Telephone Encounter (Signed)
Form faxed w/ requested records [facility] [phone]

## 2021-12-24 NOTE — Telephone Encounter (Signed)
I printed this form and completed it for patient. Can you please try to get it sent in for him today and let [facility] know this has been taken care of? Thanks

## 2022-02-10 NOTE — Pre-Procedure Instructions (Addendum)
Surgical Instructions    Your procedure is scheduled on Thursday, February 23rd.  Report to [facility] Main Entrance "A" at 05:30 A.M., then check in with the Admitting office.  Call this number if you have problems the morning of surgery:  [phone]   If you have any questions prior to your surgery date call [phone]: Open Monday-Friday 8am-4pm    Remember:  Do not eat after midnight the night before your surgery  You may drink clear liquids until 04:30 AM the morning of your surgery.   Clear liquids allowed are: Water, Non-Citrus Juices (without pulp), Carbonated Beverages, Clear Tea, Black Coffee Only (NO MILK, CREAM OR POWDERED CREAMER of any kind), and Gatorade.  Patient Instructions  The night before surgery:  No food after midnight. ONLY clear liquids after midnight  The day of surgery (if you do NOT have diabetes):  Drink ONE (1) Pre-Surgery Clear Ensure by 04:30 AM the morning of surgery. Drink in one sitting. Do not sip.  This drink was given to you during your hospital  pre-op appointment visit.  Nothing else to drink after completing the  Pre-Surgery Clear Ensure.          If you have questions, please contact your surgeons office.       Take these medicines the morning of surgery with A SIP OF WATER  acetaminophen (TYLENOL)- if needed  As of today, STOP taking any Aspirin (unless otherwise instructed by your surgeon) Aleve, Naproxen, Ibuprofen, Motrin, Advil, Goody's, BC's, all herbal medications, fish oil, and all vitamins.                     Do NOT Smoke (Tobacco/Vaping) for 24 hours prior to your procedure.  If you use a CPAP at night, you may bring your mask/headgear for your overnight stay.   Contacts, glasses, piercing's, hearing aid's, dentures or partials may not be worn into surgery, please bring cases for these belongings.    For patients admitted to the hospital, discharge time will be determined by your treatment team.   Patients  discharged the day of surgery will not be allowed to drive home, and someone needs to stay with them for 24 hours.  NO VISITORS WILL BE ALLOWED IN PRE-OP WHERE PATIENTS ARE PREPPED FOR SURGERY.  ONLY 1 SUPPORT PERSON MAY BE PRESENT IN THE WAITING ROOM WHILE YOU ARE IN SURGERY.  IF YOU ARE TO BE ADMITTED, ONCE YOU ARE IN YOUR ROOM YOU WILL BE ALLOWED TWO (2) VISITORS. (1) VISITOR MAY STAY OVERNIGHT BUT MUST ARRIVE TO THE ROOM BY 8pm.  Minor children may have two parents present. Special consideration for safety and communication needs will be reviewed on a case by case basis.   Special instructions:    [place]- Preparing for Shoulder Surgery  ?  Before surgery, you can play an important role. Because skin is not sterile, your skin needs to be as free of germs as possible. You can reduce the number of germs on your skin by using the following products.   1). Benzoyl Peroxide Gel: reduces the number of germs present on the skin   *Applied twice a day to shoulder area starting two days before surgery     2). Chlorhexidine Gluconate (CHG) Soap: An antiseptic cleaner that kills germs and bonds with the skin to continue killing germs even after washing   *Used for showering the night before surgery and morning of surgery   ?    Please follow  these instructions carefully:     1). BENZOYL PEROXIDE 5% GEL (Please do not use if you have an allergy to benzoyl peroxide.   If your skin becomes reddened/irritated stop using the benzoyl peroxide)     Starting TWO DAYS BEFORE surgery:    Apply benzoyl peroxide in the morning and at night. Apply after taking a shower. If you are not taking a shower clean entire shoulder front, back, and side along with the armpit with a clean wet washcloth.     Place a quarter-sized amount of gel on your shoulder and rub in thoroughly, making sure to cover the front, back, and side of your shoulder, along with the armpit.                           Do this  twice a day for two days.  (Last application is the night before surgery, AFTER using the CHG soap as described below).   Do NOT apply benzoyl peroxide gel on the day of surgery.   2 days before ____ AM   ____ PM              1 day before ____ AM   ____ PM    2) CHG Soap: Please do not use if you have an allergy to CHG or antibacterial soaps. If your skin becomes reddened/irritated stop using the CHG.  Do not shave (including legs and underarms) for at least 48 hours prior to first CHG shower. It is OK to shave your face.  Please follow these instructions carefully.   Shower the NIGHT BEFORE SURGERY (before applying benzoyl peroxide gel) and the MORNING OF SURGERY with CHG Soap.   If you chose to wash your hair, wash your hair first as usual with your normal shampoo.  After you shampoo, rinse your hair and body thoroughly to remove the shampoo.  Use CHG as you would any other liquid soap. You can apply CHG directly to the skin and wash gently with a scrungie or a clean washcloth.   Apply the CHG Soap to your body ONLY FROM THE NECK DOWN.  Do not use on open wounds or open sores. Avoid contact with your eyes, ears, mouth and genitals (private parts). Wash Face and genitals (private parts) with your normal soap.   Wash thoroughly, paying special attention to the area where your surgery will be performed.  Thoroughly rinse your body with warm water from the neck down.  DO NOT shower/wash with your normal soap after using and rinsing off the CHG Soap.  Pat yourself dry with a CLEAN TOWEL.  Wear CLEAN PAJAMAS to bed the night before surgery  Place CLEAN SHEETS on your bed the night of your first shower and DO NOT SLEEP WITH PETS.     Day of Surgery: Shower with CHG soap. Do not wear jewelry Do not wear lotions, powders, colognes, or deodorant. Do not shave 48 hours prior to surgery.  Men may shave face and neck. Do not bring valuables to the hospital. [facility] is not  responsible for any belongings or valuables. Wear Clean/Comfortable clothing the morning of surgery Remember to brush your teeth WITH YOUR REGULAR TOOTHPASTE.   Please read over the following fact sheets that you were given.   3 days prior to your procedure or After your COVID test   You are not required to quarantine however you are required to wear a well-fitting mask when you are  out and around people not in your household. If your mask becomes wet or soiled, replace with a new one.   Wash your hands often with soap and water for 20 seconds or clean your hands with an alcohol-based hand sanitizer that contains at least 60% alcohol.   Do not share personal items.   Notify your provider:  o if you are in close contact with someone who has COVID  o or if you develop a fever of 100.4 or greater, sneezing, cough, sore throat, shortness of breath or body aches.

## 2022-02-11 ENCOUNTER — Encounter (HOSPITAL_COMMUNITY)
Admission: RE | Admit: 2022-02-11 | Discharge: 2022-02-11 | Disposition: A | Discharge status: Home / Self Care | Payer: Self-pay | Source: Ambulatory Visit | Attending: Orthopedic Surgery | Admitting: Orthopedic Surgery

## 2022-02-11 ENCOUNTER — Telehealth: Payer: Self-pay | Admitting: Orthopedic Surgery

## 2022-02-11 ENCOUNTER — Other Ambulatory Visit: Payer: Self-pay

## 2022-02-11 ENCOUNTER — Encounter (HOSPITAL_COMMUNITY): Payer: Self-pay

## 2022-02-11 VITALS — BP 130/94 | HR 87 | Temp 98.4°F | Resp 17 | Ht 67.0 in | Wt 283.2 lb

## 2022-02-11 DIAGNOSIS — Z01812 Encounter for preprocedural laboratory examination: Secondary | ICD-10-CM | POA: Insufficient documentation

## 2022-02-11 DIAGNOSIS — Z01818 Encounter for other preprocedural examination: Secondary | ICD-10-CM

## 2022-02-11 LAB — CBC
HCT: 46.4 % (ref 39.0–52.0)
Hemoglobin: 14.4 g/dL (ref 13.0–17.0)
MCH: 29.9 pg (ref 26.0–34.0)
MCHC: 31 g/dL (ref 30.0–36.0)
MCV: 96.3 fL (ref 80.0–100.0)
Platelets: 289 10*3/uL (ref 150–400)
RBC: 4.82 MIL/uL (ref 4.22–5.81)
RDW: 12 % (ref 11.5–15.5)
WBC: 6.9 10*3/uL (ref 4.0–10.5)
nRBC: 0 % (ref 0.0–0.2)

## 2022-02-11 LAB — TYPE AND SCREEN
ABO/RH(D): O POS
Antibody Screen: NEGATIVE

## 2022-02-11 LAB — URINALYSIS, ROUTINE W REFLEX MICROSCOPIC
Bilirubin Urine: NEGATIVE
Glucose, UA: NEGATIVE mg/dL
Hgb urine dipstick: NEGATIVE
Ketones, ur: NEGATIVE mg/dL
Leukocytes,Ua: NEGATIVE
Nitrite: NEGATIVE
Protein, ur: NEGATIVE mg/dL
Specific Gravity, Urine: 1.023 (ref 1.005–1.030)
pH: 6 (ref 5.0–8.0)

## 2022-02-11 LAB — SURGICAL PCR SCREEN
MRSA, PCR: POSITIVE — AB
Staphylococcus aureus: POSITIVE — AB

## 2022-02-11 LAB — BASIC METABOLIC PANEL
Anion gap: 8 (ref 5–15)
BUN: 13 mg/dL (ref 6–20)
CO2: 24 mmol/L (ref 22–32)
Calcium: 8.9 mg/dL (ref 8.9–10.3)
Chloride: 106 mmol/L (ref 98–111)
Creatinine, Ser: 1.02 mg/dL (ref 0.61–1.24)
GFR, Estimated: >60 mL/min (ref >60)
Glucose, Bld: 104 mg/dL — ABNORMAL HIGH (ref 70–99)
Potassium: 4.5 mmol/L (ref 3.5–5.1)
Sodium: 138 mmol/L (ref 135–145)

## 2022-02-11 NOTE — Telephone Encounter (Signed)
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 Do you want to add an antibiotic to patients orders?

## 2022-02-11 NOTE — Progress Notes (Signed)
PCP - denies Cardiologist - denies  PPM/ICD - denies   Chest x-ray - 04/19/20 EKG - 04/21/20 Stress Test - denies ECHO - denies Cardiac Cath - denies  Sleep Study - denies   DM- denies  Blood Thinner Instructions: n/a Aspirin Instructions: n/a  ERAS Protcol - yes PRE-SURGERY Ensure given at PAT  COVID TEST- pt scheduled for testing on 02/15/22   Anesthesia review: no  Patient denies shortness of breath, fever, cough and chest pain at PAT appointment   All instructions explained to the patient, with a verbal understanding of the material. Patient agrees to go over the instructions while at home for a better understanding. Patient also instructed to wear a mask in public after being tested for COVID-19. The opportunity to ask questions was provided.

## 2022-02-11 NOTE — Telephone Encounter (Signed)
I added Vancomycin to his preop abx.  Thank you

## 2022-02-11 NOTE — Progress Notes (Signed)
LVM with Debbie, surgical scheduler for Dr. Marlou Sa regarding pt being positive for MRSA/MSSA.  Jacqlyn Larsen, RN

## 2022-02-12 LAB — URINE CULTURE: Culture: NO GROWTH

## 2022-02-15 ENCOUNTER — Other Ambulatory Visit (HOSPITAL_COMMUNITY)
Admission: RE | Admit: 2022-02-15 | Discharge: 2022-02-15 | Disposition: A | Discharge status: Home / Self Care | Payer: Self-pay | Source: Ambulatory Visit | Attending: Orthopedic Surgery | Admitting: Orthopedic Surgery

## 2022-02-15 DIAGNOSIS — Z20822 Contact with and (suspected) exposure to covid-19: Secondary | ICD-10-CM | POA: Insufficient documentation

## 2022-02-15 LAB — SARS CORONAVIRUS 2 (TAT 6-24 HRS): SARS Coronavirus 2: NEGATIVE

## 2022-02-18 ENCOUNTER — Ambulatory Visit (HOSPITAL_COMMUNITY): Admission: RE | Admit: 2022-02-18 | Payer: Self-pay | Source: Ambulatory Visit | Admitting: Orthopedic Surgery

## 2022-02-18 DIAGNOSIS — Z01818 Encounter for other preprocedural examination: Secondary | ICD-10-CM

## 2022-02-18 SURGERY — ARTHROPLASTY, SHOULDER, TOTAL, REVERSE
Anesthesia: General | Site: Shoulder | Laterality: Left

## 2022-03-05 ENCOUNTER — Encounter: Payer: Self-pay | Admitting: Orthopedic Surgery

## 2022-03-08 ENCOUNTER — Encounter: Payer: Self-pay | Admitting: Orthopedic Surgery

## 2022-10-04 ENCOUNTER — Encounter (HOSPITAL_COMMUNITY): Payer: Self-pay | Admitting: *Deleted

## 2022-10-04 ENCOUNTER — Other Ambulatory Visit: Payer: Self-pay

## 2022-10-04 ENCOUNTER — Other Ambulatory Visit (HOSPITAL_COMMUNITY): Payer: Self-pay

## 2022-10-04 ENCOUNTER — Emergency Department (HOSPITAL_COMMUNITY)
Admission: EM | Admit: 2022-10-04 | Discharge: 2022-10-04 | Disposition: A | Discharge status: Home / Self Care | Payer: Self-pay | Attending: Emergency Medicine | Admitting: Emergency Medicine

## 2022-10-04 DIAGNOSIS — M25511 Pain in right shoulder: Secondary | ICD-10-CM | POA: Insufficient documentation

## 2022-10-04 MED ORDER — METHOCARBAMOL 500 MG PO TABS
500.0000 mg | ORAL_TABLET | Freq: Three times a day (TID) | ORAL | 0 refills | Status: DC | PRN
  Filled 2022-10-04: qty 15, 5d supply, fill #0

## 2022-10-04 MED ORDER — NAPROXEN 250 MG PO TABS
500.0000 mg | ORAL_TABLET | Freq: Once | ORAL | Status: AC
  Administered 2022-10-04: 500 mg via ORAL
  Filled 2022-10-04: qty 2

## 2022-10-04 MED ORDER — NAPROXEN 500 MG PO TABS
500.0000 mg | ORAL_TABLET | Freq: Two times a day (BID) | ORAL | 0 refills | Status: DC
  Filled 2022-10-04: qty 30, 15d supply, fill #0

## 2022-10-04 NOTE — ED Triage Notes (Signed)
C/o right shoulder pain x 1 month worse tonight

## 2022-10-04 NOTE — ED Provider Notes (Signed)
[facility] EMERGENCY DEPARTMENT Provider Note   CSN: [id number] Arrival date & time: 10/04/22  4098     History  Chief Complaint  Patient presents with   Shoulder Pain    Jeffrey Cox is a 51 y.o. male.  51 year old male presents to the emergency department for evaluation of right shoulder pain x1 month.  Came to the emergency department tonight because he woke up from sleep with severe pain and was crying.  His partner convinced him to come to the emergency department for evaluation.  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  Patient is left-hand dominant.  He has taken Tylenol for pain which "takes the edge off".  He has not followed up with a primary care doctor or specialist regarding these ongoing complaints.  The history is provided by the patient. No language interpreter was used.  Shoulder Pain      Home Medications Prior to Admission medications   Medication Sig Start Date End Date Taking? Authorizing Provider  naproxen (NAPROSYN) 500 MG tablet Take 1 tablet (500 mg total) by mouth 2 (two) times daily with a meal. 10/04/22  Yes Antonietta Breach, PA-C  acetaminophen (TYLENOL) 500 MG tablet Take 500-1,000 mg by mouth every 6 (six) hours as needed for moderate pain.    [provider]  methocarbamol (ROBAXIN) 500 MG tablet Take 1 tablet (500 mg total) by mouth every 8 (eight) hours as needed for muscle spasms. 10/04/22   Antonietta Breach, PA-C      Allergies    Other    Review of Systems   Review of Systems Ten systems reviewed and are negative for acute change, except as noted in the HPI.    Physical Exam Updated Vital Signs BP (!) 136/103 (BP Location: Right Arm)   Pulse 92   Temp 98.2 F (36.8 C) (Oral)   Resp (!) 24   Ht 5\' 7"  (1.702 m)   Wt 127 kg   SpO2 98%   BMI 43.85 kg/m   Physical Exam Vitals and nursing note reviewed.  Constitutional:      General: He is not in acute distress.     Appearance: He is well-developed. He is not diaphoretic.     Comments: Nontoxic-appearing  HENT:     Head: Normocephalic and atraumatic.  Eyes:     General: No scleral icterus.    Conjunctiva/sclera: Conjunctivae normal.  Cardiovascular:     Rate and Rhythm: Normal rate and regular rhythm.     Pulses: Normal pulses.     Comments: Distal radial pulse 2+ in the right upper extremity Pulmonary:     Effort: Pulmonary effort is normal. No respiratory distress.     Comments: Respirations even and unlabored Musculoskeletal:        General: Normal range of motion.     Cervical back: Normal range of motion.     Comments: Fall, preserved range of motion of the right shoulder  Skin:    General: Skin is warm and dry.     Coloration: Skin is not pale.     Findings: No erythema or rash.  Neurological:     Mental Status: He is alert and oriented to person, place, and time.     Comments: Sensation to light touch intact in the right upper extremity.  Preserved grip strength.  Psychiatric:        Behavior: Behavior normal.     ED Results / Procedures /  Treatments   Labs (all labs ordered are listed, but only abnormal results are displayed) Labs Reviewed - No data to display  EKG None  Radiology No results found.  Procedures Procedures    Medications Ordered in ED Medications  naproxen (NAPROSYN) tablet 500 mg (500 mg Oral Given 10/04/22 0433)    ED Course/ Medical Decision Making/ A&P                           Medical Decision Making Risk Prescription drug management.   This patient presents to the ED for concern of R shoulder pain, this involves an extensive number of treatment options, and is a complaint that carries with it a high risk of complications and morbidity.  The differential diagnosis includes sprain/strain vs dislocation vs fracture   Co morbidities that complicate the patient evaluation  Anxiety/depression   Additional history obtained:  External  records from outside source obtained and reviewed including prior imaging of the LEFT shoulder   Cardiac Monitoring:  The patient was maintained on a cardiac monitor.  I personally viewed and interpreted the cardiac monitored which showed an underlying rhythm of: NSR   Medicines ordered and prescription drug management:  I ordered medication including Naproxen for pain  Reevaluation of the patient after these medicines showed that the patient stayed the same I have reviewed the patients home medicines and have made adjustments as needed   Test Considered:  Xray shoulder   Problem List / ED Course:  Patient neurovascularly intact on exam.  No erythema, heat to touch to the affected area; no concern for septic joint.  Compartments in the affected extremity are soft.    Reevaluation:  After the interventions noted above, I reevaluated the patient and found that they have :stayed the same   Social Determinants of Health:  Uninsured patient Established with PCP   Dispostion:  After consideration of the diagnostic results and the patients response to treatment results and the patients response to treatment, I feel that the patent would benefit from supportive management including RICE and NSAIDs; primary care follow up PRN. Return precautions discussed and provided. Patient discharged in stable condition with no unaddressed concerns..           Final Clinical Impression(s) / ED Diagnoses Final diagnoses:  Acute pain of right shoulder    Rx / DC Orders ED Discharge Orders          Ordered    methocarbamol (ROBAXIN) 500 MG tablet  Every 8 hours PRN        10/04/22 0422    naproxen (NAPROSYN) 500 MG tablet  2 times daily with meals        10/04/22 0422              Antony Madura, PA-C 10/04/22 0556    Tilden Fossa, MD 10/05/22 [phone]

## 2022-10-04 NOTE — Discharge Instructions (Addendum)
Alternate ice and heat to areas of injury 3-4 times per day to limit inflammation and spasm.  Avoid strenuous activity and heavy lifting.  We recommend consistent use of naproxen in addition to Robaxin for muscle spasms.  Do not drive or drink alcohol after taking Robaxin as it may make you drowsy and impair your judgment.  We recommend follow-up with Orthopedics to ensure resolution of symptoms.  Return to the ED for any new or concerning symptoms.

## 2024-05-23 ENCOUNTER — Other Ambulatory Visit: Payer: Self-pay

## 2024-05-23 ENCOUNTER — Encounter (HOSPITAL_BASED_OUTPATIENT_CLINIC_OR_DEPARTMENT_OTHER): Payer: Self-pay | Admitting: *Deleted

## 2024-05-23 ENCOUNTER — Emergency Department (HOSPITAL_BASED_OUTPATIENT_CLINIC_OR_DEPARTMENT_OTHER)
Admission: EM | Admit: 2024-05-23 | Discharge: 2024-05-23 | Disposition: A | Discharge status: Home / Self Care | Payer: Self-pay | Attending: Emergency Medicine | Admitting: Emergency Medicine

## 2024-05-23 ENCOUNTER — Emergency Department (HOSPITAL_BASED_OUTPATIENT_CLINIC_OR_DEPARTMENT_OTHER): Payer: Self-pay | Admitting: Radiology

## 2024-05-23 DIAGNOSIS — M25532 Pain in left wrist: Secondary | ICD-10-CM | POA: Insufficient documentation

## 2024-05-23 MED ORDER — KETOROLAC TROMETHAMINE 15 MG/ML IJ SOLN
15.0000 mg | Freq: Once | INTRAMUSCULAR | Status: AC
  Administered 2024-05-23: 15 mg via INTRAMUSCULAR
  Filled 2024-05-23: qty 1

## 2024-05-23 NOTE — Discharge Instructions (Addendum)
 You were evaluated in the emergency room for wrist pain.  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  You were fitted with a brace.  Please wear this throughout the day.  You may additionally alternate Tylenol  and ibuprofen  and apply ice as well.

## 2024-05-23 NOTE — ED Triage Notes (Signed)
 Left wrist pain x 3 days. Movement causes pain to radiate up through arm. No injury, no swelling noted.
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
## 2024-05-23 NOTE — ED Notes (Signed)
 RN reviewed discharge instructions with pt. Pt verbalized understanding and had no further questions

## 2024-11-16 ENCOUNTER — Encounter (HOSPITAL_BASED_OUTPATIENT_CLINIC_OR_DEPARTMENT_OTHER): Payer: Self-pay | Admitting: Emergency Medicine

## 2024-11-16 ENCOUNTER — Emergency Department (HOSPITAL_BASED_OUTPATIENT_CLINIC_OR_DEPARTMENT_OTHER)
Admission: EM | Admit: 2024-11-16 | Discharge: 2024-11-16 | Disposition: A | Discharge status: Home / Self Care | Attending: Emergency Medicine | Admitting: Emergency Medicine

## 2024-11-16 ENCOUNTER — Other Ambulatory Visit: Payer: Self-pay

## 2024-11-16 ENCOUNTER — Emergency Department (HOSPITAL_BASED_OUTPATIENT_CLINIC_OR_DEPARTMENT_OTHER)

## 2024-11-16 DIAGNOSIS — M778 Other enthesopathies, not elsewhere classified: Secondary | ICD-10-CM | POA: Diagnosis not present

## 2024-11-16 DIAGNOSIS — M7989 Other specified soft tissue disorders: Secondary | ICD-10-CM | POA: Diagnosis present

## 2024-11-16 DIAGNOSIS — M25531 Pain in right wrist: Secondary | ICD-10-CM

## 2024-11-16 DIAGNOSIS — M779 Enthesopathy, unspecified: Secondary | ICD-10-CM

## 2024-11-16 MED ORDER — IBUPROFEN 800 MG PO TABS: 800.0000 mg | ORAL_TABLET | Freq: Four times a day (QID) | ORAL | 0 refills | Status: AC | PRN

## 2024-11-16 NOTE — ED Triage Notes (Signed)
  Patient comes in with R wrist pain that has been going on for 3 days.  Patient states he felt soreness in R wrist after work on Tuesday and took tylenol  at home.  Went to work the next two days and wrist started swelling.  Patient endorses pain when opening hand and making a fist.  Pain 8/10, throbbing.  Last tylenol  was around 2100 last night.
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
## 2024-11-19 ENCOUNTER — Encounter: Payer: Self-pay | Admitting: Internal Medicine

## 2025-01-25 ENCOUNTER — Emergency Department (HOSPITAL_BASED_OUTPATIENT_CLINIC_OR_DEPARTMENT_OTHER)
Admission: EM | Admit: 2025-01-25 | Discharge: 2025-01-25 | Disposition: A | Discharge status: Home / Self Care | Attending: Emergency Medicine | Admitting: Emergency Medicine

## 2025-01-25 ENCOUNTER — Other Ambulatory Visit: Payer: Self-pay

## 2025-01-25 ENCOUNTER — Encounter (HOSPITAL_BASED_OUTPATIENT_CLINIC_OR_DEPARTMENT_OTHER): Payer: Self-pay

## 2025-01-25 DIAGNOSIS — M25561 Pain in right knee: Secondary | ICD-10-CM | POA: Diagnosis present

## 2025-01-25 MED ORDER — KETOROLAC TROMETHAMINE 30 MG/ML IJ SOLN
30.0000 mg | Freq: Once | INTRAMUSCULAR | Status: AC
  Administered 2025-01-25: 30 mg via INTRAMUSCULAR
  Filled 2025-01-25: qty 1

## 2025-01-25 MED ORDER — MELOXICAM 15 MG PO TABS: 15.0000 mg | ORAL_TABLET | Freq: Every day | ORAL | 0 refills | Status: AC

## 2025-01-25 NOTE — ED Triage Notes (Signed)
 R knee pain x3 weeks. Had knee drained and a steroid shot earlier this week. No trauma or injury.

## 2025-01-25 NOTE — Discharge Instructions (Addendum)
 You were seen today for knee pain.  At this point you should probably see orthopedics.  Follow-up with your primary doctor to get information regarding the fluid analysis from your knee joint.  Make sure that you are taking meloxicam  daily.

## 2025-01-25 NOTE — ED Provider Notes (Signed)
 " [place] EMERGENCY DEPARTMENT AT [facility] Provider Note   CSN: [id number] Arrival date & time: 01/25/25  9565     Patient presents with: No chief complaint on file.   Jeffrey Cox is a 54 y.o. male.   HPI     This is a 54 year old male who presents with concern for knee pain.  Patient reports that he went to his primary doctor approximately 1 week ago and had fluid drained from his knee and a steroid shot.  He has had persistent knee pain right greater than left.  No fevers.  No systemic symptoms.  States that he went to work and had increasing pain.  Has been using Tylenol  without relief.  Does have meloxicam  but is not using it regularly.  Prior to Admission medications  Medication Sig Start Date End Date Taking? Authorizing Provider  meloxicam  (MOBIC ) 15 MG tablet Take 1 tablet (15 mg total) by mouth daily. 01/25/25  Yes Jamari Diana, Charmaine FALCON, MD  acetaminophen  (TYLENOL ) 500 MG tablet Take 500-1,000 mg by mouth every 6 (six) hours as needed for moderate pain.    [provider]  ibuprofen  (ADVIL ) 800 MG tablet Take 1 tablet (800 mg total) by mouth every 6 (six) hours as needed for moderate pain (pain score 4-6). 11/16/24   Haze Lonni JINNY, MD    Allergies: Other    Review of Systems  Constitutional:  Negative for fever.  Musculoskeletal:        Knee pain  All other systems reviewed and are negative.   Updated Vital Signs BP 108/82 (BP Location: Right Arm)   Pulse (!) 107   Temp 98.1 F (36.7 C) (Oral)   Resp 20   SpO2 95%   Physical Exam Vitals and nursing note reviewed.  Constitutional:      Appearance: He is well-developed. He is obese. He is not ill-appearing.  HENT:     Head: Normocephalic and atraumatic.  Eyes:     Pupils: Pupils are equal, round, and reactive to light.  Cardiovascular:     Rate and Rhythm: Normal rate and regular rhythm.  Pulmonary:     Effort: Pulmonary effort is normal. No respiratory distress.   Musculoskeletal:     Cervical back: Neck supple.     Comments: Focused examination of the knees, right knee with small effusion, no overlying skin changes, normal range of motion Left knee, no palpable effusion, no joint line tenderness, normal, no overlying skin changes  Lymphadenopathy:     Cervical: No cervical adenopathy.  Skin:    General: Skin is warm and dry.  Neurological:     Mental Status: He is alert and oriented to person, place, and time.  Psychiatric:        Mood and Affect: Mood normal.     (all labs ordered are listed, but only abnormal results are displayed) Labs Reviewed - No data to display  EKG: None  Radiology: No results found.   Procedures   Medications Ordered in the ED  ketorolac  (TORADOL ) 30 MG/ML injection 30 mg (30 mg Intramuscular Given 01/25/25 0526)                                    Medical Decision Making Risk Prescription drug management.   This patient presents to the ED for concern of knee pain, this involves an extensive number of treatment options, and is a complaint that  carries with it a high risk of complications and morbidity.  I considered the following differential and admission for this acute, potentially life threatening condition.  The differential diagnosis includes arthritis, gout, septic arthritis, injury  MDM:    This is a 54 year old male who presents with right greater than left knee pain.  He is nontoxic and vital signs are reassuring.  No signs or symptoms of infection.  He does have a residual small effusion on the right.  Normal range of motion.  Symptoms are acute on chronic.  Feel that he likely would benefit from scheduled anti-inflammatories and have reinforced that he should take meloxicam  daily.  He does tolerate this medication.  Otherwise would recommend follow-up with orthopedics given ongoing issues.  He will follow-up with his primary doctor regarding synovial fluid analysis.  (Labs, imaging,  consults)  Labs: I Ordered, and personally interpreted labs.  The pertinent results include: N/A  Imaging Studies ordered: I ordered imaging studies including N/A I independently visualized and interpreted imaging. I agree with the radiologist interpretation  Additional history obtained from chart review.  External records from outside source obtained and reviewed including prior evaluations  Cardiac Monitoring: The patient was not maintained on a cardiac monitor.  If on the cardiac monitor, I personally viewed and interpreted the cardiac monitored which showed an underlying rhythm of: NS  Reevaluation: After the interventions noted above, I reevaluated the patient and found that they have :improved  Social Determinants of Health:  lives independently  Disposition: Discharge  Co morbidities that complicate the patient evaluation  Past Medical History:  Diagnosis Date   Anxiety    Arthritis    Depression    Headache    cluster headaches     Medicines Meds ordered this encounter  Medications   ketorolac  (TORADOL ) 30 MG/ML injection 30 mg   meloxicam  (MOBIC ) 15 MG tablet    Sig: Take 1 tablet (15 mg total) by mouth daily.    Dispense:  30 tablet    Refill:  0    I have reviewed the patients home medicines and have made adjustments as needed  Problem List / ED Course: Problem List Items Addressed This Visit   None Visit Diagnoses       Acute pain of right knee    -  Primary                Final diagnoses:  Acute pain of right knee    ED Discharge Orders          Ordered    meloxicam  (MOBIC ) 15 MG tablet  Daily        01/25/25 0505               Bari Charmaine FALCON, MD 01/25/25 [phone]  "
